# Patient Record
Sex: Female | Born: 1935 | Race: White | Hispanic: No | Marital: Married | Smoking: Never smoker
Health system: Southern US, Community
[De-identification: ages and names within clinical notes are randomized; demographics above are authoritative.]

## PROBLEM LIST (undated history)

## (undated) DIAGNOSIS — J302 Other seasonal allergic rhinitis: Secondary | ICD-10-CM

## (undated) DIAGNOSIS — M2540 Effusion, unspecified joint: Secondary | ICD-10-CM

## (undated) DIAGNOSIS — I10 Essential (primary) hypertension: Secondary | ICD-10-CM

## (undated) DIAGNOSIS — H9190 Unspecified hearing loss, unspecified ear: Secondary | ICD-10-CM

## (undated) DIAGNOSIS — H269 Unspecified cataract: Secondary | ICD-10-CM

## (undated) DIAGNOSIS — Z8711 Personal history of peptic ulcer disease: Secondary | ICD-10-CM

## (undated) DIAGNOSIS — Z8719 Personal history of other diseases of the digestive system: Secondary | ICD-10-CM

## (undated) DIAGNOSIS — Z87442 Personal history of urinary calculi: Secondary | ICD-10-CM

## (undated) DIAGNOSIS — M359 Systemic involvement of connective tissue, unspecified: Secondary | ICD-10-CM

## (undated) DIAGNOSIS — K219 Gastro-esophageal reflux disease without esophagitis: Secondary | ICD-10-CM

## (undated) DIAGNOSIS — K5790 Diverticulosis of intestine, part unspecified, without perforation or abscess without bleeding: Secondary | ICD-10-CM

## (undated) DIAGNOSIS — Z9981 Dependence on supplemental oxygen: Secondary | ICD-10-CM

## (undated) DIAGNOSIS — M069 Rheumatoid arthritis, unspecified: Secondary | ICD-10-CM

## (undated) DIAGNOSIS — M2550 Pain in unspecified joint: Secondary | ICD-10-CM

## (undated) DIAGNOSIS — M1990 Unspecified osteoarthritis, unspecified site: Secondary | ICD-10-CM

## (undated) DIAGNOSIS — J45909 Unspecified asthma, uncomplicated: Secondary | ICD-10-CM

## (undated) DIAGNOSIS — E785 Hyperlipidemia, unspecified: Secondary | ICD-10-CM

## (undated) HISTORY — PX: TONSILLECTOMY: SUR1361

## (undated) HISTORY — PX: COLONOSCOPY: SHX174

## (undated) HISTORY — PX: LITHOTRIPSY: SUR834

## (undated) HISTORY — PX: BIOPSY THYROID: PRO38

## (undated) HISTORY — PX: ABDOMINAL HYSTERECTOMY: SHX81

## (undated) HISTORY — PX: APPENDECTOMY: SHX54

## (undated) HISTORY — PX: CHOLECYSTECTOMY: SHX55

## (undated) HISTORY — PX: COLONOSCOPY WITH ESOPHAGOGASTRODUODENOSCOPY (EGD) AND ESOPHAGEAL DILATION (ED): SHX6495

## (undated) HISTORY — PX: OTHER SURGICAL HISTORY: SHX169

---

## 2014-12-30 ENCOUNTER — Emergency Department (HOSPITAL_COMMUNITY)
Admission: EM | Admit: 2014-12-30 | Discharge: 2014-12-30 | Disposition: A | Discharge status: Home / Self Care | Payer: Medicare Other | Attending: Emergency Medicine | Admitting: Emergency Medicine

## 2014-12-30 ENCOUNTER — Emergency Department (HOSPITAL_COMMUNITY): Payer: Medicare Other

## 2014-12-30 ENCOUNTER — Encounter (HOSPITAL_COMMUNITY): Payer: Self-pay | Admitting: Emergency Medicine

## 2014-12-30 DIAGNOSIS — Z9981 Dependence on supplemental oxygen: Secondary | ICD-10-CM | POA: Diagnosis not present

## 2014-12-30 DIAGNOSIS — R109 Unspecified abdominal pain: Secondary | ICD-10-CM

## 2014-12-30 DIAGNOSIS — I10 Essential (primary) hypertension: Secondary | ICD-10-CM | POA: Insufficient documentation

## 2014-12-30 DIAGNOSIS — R1013 Epigastric pain: Secondary | ICD-10-CM | POA: Insufficient documentation

## 2014-12-30 DIAGNOSIS — J45909 Unspecified asthma, uncomplicated: Secondary | ICD-10-CM | POA: Diagnosis not present

## 2014-12-30 DIAGNOSIS — Z7951 Long term (current) use of inhaled steroids: Secondary | ICD-10-CM | POA: Insufficient documentation

## 2014-12-30 DIAGNOSIS — Z9049 Acquired absence of other specified parts of digestive tract: Secondary | ICD-10-CM | POA: Insufficient documentation

## 2014-12-30 DIAGNOSIS — Z7982 Long term (current) use of aspirin: Secondary | ICD-10-CM | POA: Insufficient documentation

## 2014-12-30 DIAGNOSIS — Z9889 Other specified postprocedural states: Secondary | ICD-10-CM | POA: Diagnosis not present

## 2014-12-30 DIAGNOSIS — Z8719 Personal history of other diseases of the digestive system: Secondary | ICD-10-CM | POA: Insufficient documentation

## 2014-12-30 DIAGNOSIS — Z79899 Other long term (current) drug therapy: Secondary | ICD-10-CM | POA: Insufficient documentation

## 2014-12-30 DIAGNOSIS — Z90710 Acquired absence of both cervix and uterus: Secondary | ICD-10-CM | POA: Insufficient documentation

## 2014-12-30 HISTORY — DX: Unspecified asthma, uncomplicated: J45.909

## 2014-12-30 HISTORY — DX: Essential (primary) hypertension: I10

## 2014-12-30 HISTORY — DX: Dependence on supplemental oxygen: Z99.81

## 2014-12-30 HISTORY — DX: Other seasonal allergic rhinitis: J30.2

## 2014-12-30 LAB — COMPREHENSIVE METABOLIC PANEL
ALK PHOS: 43 U/L (ref 38–126)
ALT: 31 U/L (ref 14–54)
AST: 37 U/L (ref 15–41)
Albumin: 4.2 g/dL (ref 3.5–5.0)
Anion gap: 11 (ref 5–15)
BUN: 11 mg/dL (ref 6–20)
CALCIUM: 9.7 mg/dL (ref 8.9–10.3)
CO2: 27 mmol/L (ref 22–32)
Chloride: 104 mmol/L (ref 101–111)
Creatinine, Ser: 0.8 mg/dL (ref 0.44–1.00)
GLUCOSE: 98 mg/dL (ref 65–99)
POTASSIUM: 3.8 mmol/L (ref 3.5–5.1)
SODIUM: 142 mmol/L (ref 135–145)
Total Bilirubin: 0.7 mg/dL (ref 0.3–1.2)
Total Protein: 7.1 g/dL (ref 6.5–8.1)

## 2014-12-30 LAB — URINE MICROSCOPIC-ADD ON

## 2014-12-30 LAB — URINALYSIS, ROUTINE W REFLEX MICROSCOPIC
Bilirubin Urine: NEGATIVE
Glucose, UA: NEGATIVE mg/dL
HGB URINE DIPSTICK: NEGATIVE
Ketones, ur: NEGATIVE mg/dL
Nitrite: NEGATIVE
Protein, ur: NEGATIVE mg/dL
UROBILINOGEN UA: 0.2 mg/dL (ref 0.0–1.0)
pH: 6 (ref 5.0–8.0)

## 2014-12-30 LAB — CBC WITH DIFFERENTIAL/PLATELET
Basophils Absolute: 0 10*3/uL (ref 0.0–0.1)
Basophils Relative: 0 % (ref 0–1)
EOS ABS: 0.3 10*3/uL (ref 0.0–0.7)
EOS PCT: 5 % (ref 0–5)
HEMATOCRIT: 33.8 % — AB (ref 36.0–46.0)
Hemoglobin: 10.9 g/dL — ABNORMAL LOW (ref 12.0–15.0)
Lymphocytes Relative: 34 % (ref 12–46)
Lymphs Abs: 1.6 10*3/uL (ref 0.7–4.0)
MCH: 31.6 pg (ref 26.0–34.0)
MCHC: 32.2 g/dL (ref 30.0–36.0)
MCV: 98 fL (ref 78.0–100.0)
MONO ABS: 0.6 10*3/uL (ref 0.1–1.0)
Monocytes Relative: 12 % (ref 3–12)
Neutro Abs: 2.3 10*3/uL (ref 1.7–7.7)
Neutrophils Relative %: 49 % (ref 43–77)
Platelets: 185 10*3/uL (ref 150–400)
RBC: 3.45 MIL/uL — ABNORMAL LOW (ref 3.87–5.11)
RDW: 15.2 % (ref 11.5–15.5)
WBC: 4.8 10*3/uL (ref 4.0–10.5)

## 2014-12-30 LAB — LIPASE, BLOOD: LIPASE: 35 U/L (ref 22–51)

## 2014-12-30 MED ORDER — PANTOPRAZOLE SODIUM 20 MG PO TBEC: 20.0000 mg | DELAYED_RELEASE_TABLET | Freq: Every day | ORAL | Status: DC

## 2014-12-30 MED ORDER — TRAMADOL HCL 50 MG PO TABS: 50.0000 mg | ORAL_TABLET | Freq: Four times a day (QID) | ORAL | Status: DC | PRN

## 2014-12-30 MED ORDER — PANTOPRAZOLE SODIUM 40 MG IV SOLR
40.0000 mg | Freq: Once | INTRAVENOUS | Status: AC
Start: 2014-12-30 — End: 2014-12-30
  Administered 2014-12-30: 40 mg via INTRAVENOUS
  Filled 2014-12-30: qty 40

## 2014-12-30 MED ORDER — ONDANSETRON HCL 4 MG/2ML IJ SOLN
4.0000 mg | Freq: Once | INTRAMUSCULAR | Status: AC
  Administered 2014-12-30: 4 mg via INTRAVENOUS
  Filled 2014-12-30: qty 2

## 2014-12-30 MED ORDER — IOHEXOL 300 MG/ML  SOLN
25.0000 mL | Freq: Once | INTRAMUSCULAR | Status: AC | PRN
Start: 2014-12-30 — End: 2014-12-30
  Administered 2014-12-30: 25 mL via ORAL

## 2014-12-30 MED ORDER — IOHEXOL 300 MG/ML  SOLN
100.0000 mL | Freq: Once | INTRAMUSCULAR | Status: AC | PRN
  Administered 2014-12-30: 100 mL via INTRAVENOUS

## 2014-12-30 NOTE — ED Provider Notes (Signed)
CSN: [id number]     Arrival date & time 12/30/14  1517 History   First MD Initiated Contact with Patient 12/30/14 1527     Chief Complaint  Patient presents with  . Abdominal Pain     (Consider location/radiation/quality/duration/timing/severity/associated sxs/prior Treatment) Patient is a 80 y.o. female presenting with abdominal pain. The history is provided by the patient (the pt complains of upper abd pain).  Abdominal Pain Pain location:  Epigastric Pain quality: aching   Pain radiates to:  Does not radiate Pain severity:  Mild Onset quality:  Gradual Timing:  Intermittent Progression:  Waxing and waning Chronicity:  New Associated symptoms: no chest pain, no cough, no diarrhea, no fatigue and no hematuria     Past Medical History  Diagnosis Date  . Hypertension   . Seasonal allergies   . On home oxygen therapy   . Hyperlipemia   . Asthma   . Hiatal hernia    Past Surgical History  Procedure Laterality Date  . Cardiac catherization    . Abdominal hysterectomy    . Appendectomy    . Cholecystectomy    . Tonsillectomy    . Skin cancer removal     Family History  Problem Relation Age of Onset  . Cancer Mother   . Asthma Mother    History  Substance Use Topics  . Smoking status: Never Smoker   . Smokeless tobacco: Not on file  . Alcohol Use: No   OB History    Gravida Para Term Preterm AB TAB SAB Ectopic Multiple Living            1     Review of Systems  Constitutional: Negative for appetite change and fatigue.  HENT: Negative for congestion, ear discharge and sinus pressure.   Eyes: Negative for discharge.  Respiratory: Negative for cough.   Cardiovascular: Negative for chest pain.  Gastrointestinal: Positive for abdominal pain. Negative for diarrhea.  Genitourinary: Negative for frequency and hematuria.  Musculoskeletal: Negative for back pain.  Skin: Negative for rash.  Neurological: Negative for seizures and headaches.  Psychiatric/Behavioral:  Negative for hallucinations.      Allergies  Review of patient's allergies indicates no known allergies.  Home Medications   Prior to Admission medications   Medication Sig Start Date End Date Taking? Authorizing Provider  aspirin 81 MG chewable tablet Chew 81 mg by mouth daily.   Yes Historical Provider, MD  Calcium Carbonate-Vitamin D (CALTRATE 600+D PO) Take 1 tablet by mouth 2 (two) times daily.   Yes Historical Provider, MD  cholecalciferol (VITAMIN D) 1000 UNITS tablet Take 1,000 Units by mouth daily.   Yes Historical Provider, MD  CRESTOR 20 MG tablet Take 20 mg by mouth at bedtime. 12/29/14  Yes Historical Provider, MD  folic acid (FOLVITE) 1 MG tablet Take 1 mg by mouth daily. 12/18/14  Yes Historical Provider, MD  loratadine (CLARITIN) 10 MG tablet Take 10 mg by mouth daily. 11/10/14  Yes Historical Provider, MD  losartan-hydrochlorothiazide (HYZAAR) 100-12.5 MG per tablet Take 1 tablet by mouth daily.   Yes Historical Provider, MD  methotrexate (RHEUMATREX) 2.5 MG tablet Take 15 mg by mouth once a week. Monday 12/01/14  Yes Historical Provider, MD  mometasone (NASONEX) 50 MCG/ACT nasal spray Place 2 sprays into the nose daily.   Yes Historical Provider, MD  montelukast (SINGULAIR) 10 MG tablet Take 10 mg by mouth every evening. 11/10/14  Yes Historical Provider, MD  Multiple Vitamin (MULTIVITAMIN) tablet Take 1 tablet by mouth  daily.   Yes Historical Provider, MD  Omega 3 1000 MG CAPS Take 1,000 mg by mouth daily.   Yes Historical Provider, MD  pantoprazole (PROTONIX) 20 MG tablet Take 1 tablet (20 mg total) by mouth daily. 12/30/14   Milton Ferguson, MD  traMADol (ULTRAM) 50 MG tablet Take 1 tablet (50 mg total) by mouth every 6 (six) hours as needed. 12/30/14   Milton Ferguson, MD   BP 109/63 mmHg  Pulse 85  Temp(Src) 97.9 F (36.6 C) (Oral)  Resp 18  SpO2 95% Physical Exam  Constitutional: She is oriented to person, place, and time. She appears well-developed.  HENT:  Head:  Normocephalic.  Eyes: Conjunctivae and EOM are normal. No scleral icterus.  Neck: Neck supple. No thyromegaly present.  Cardiovascular: Normal rate and regular rhythm.  Exam reveals no gallop and no friction rub.   No murmur heard. Pulmonary/Chest: No stridor. She has no wheezes. She has no rales. She exhibits no tenderness.  Abdominal: She exhibits no distension. There is tenderness. There is no rebound.  Mild tender epigastric  Musculoskeletal: Normal range of motion. She exhibits no edema.  Lymphadenopathy:    She has no cervical adenopathy.  Neurological: She is oriented to person, place, and time. She exhibits normal muscle tone. Coordination normal.  Skin: No rash noted. No erythema.  Psychiatric: She has a normal mood and affect. Her behavior is normal.    ED Course  Procedures (including critical care time) Labs Review Labs Reviewed  CBC WITH DIFFERENTIAL/PLATELET - Abnormal; Notable for the following:    RBC 3.45 (*)    Hemoglobin 10.9 (*)    HCT 33.8 (*)    All other components within normal limits  URINALYSIS, ROUTINE W REFLEX MICROSCOPIC - Abnormal; Notable for the following:    Specific Gravity, Urine <1.005 (*)    Leukocytes, UA SMALL (*)    All other components within normal limits  URINE MICROSCOPIC-ADD ON - Abnormal; Notable for the following:    Squamous Epithelial / LPF FEW (*)    All other components within normal limits  COMPREHENSIVE METABOLIC PANEL  LIPASE, BLOOD    Imaging Review Ct Abdomen Pelvis W Contrast  12/30/2014   CLINICAL DATA:  Acute onset epigastric  EXAM: CT ABDOMEN AND PELVIS WITH CONTRAST  TECHNIQUE: Multidetector CT imaging of the abdomen and pelvis was performed using the standard protocol following bolus administration of intravenous contrast.  CONTRAST:  162mL OMNIPAQUE IOHEXOL 300 MG/ML  SOLN  COMPARISON:  None.  FINDINGS: Mild left basilar atelectasis is noted. A large contrast filled hiatal hernia is noted. Mild calcification is noted  at the mitral and aortic valves.  The liver and spleen are unremarkable in appearance. The gallbladder is within normal limits. The pancreas and adrenal glands are unremarkable.  Small bilateral renal cysts are seen, measuring up to 1.6 cm in size. There is mild right renal scarring. Bilateral extrarenal pelves remain within normal limits. A 4 mm nonobstructing stone is noted at the interpole region of the right kidney. No perinephric stranding is appreciated. No obstructing ureteral stones are seen. There is no evidence of hydronephrosis.  No free fluid is identified. The small bowel is unremarkable in appearance. The stomach is within normal limits. No acute vascular abnormalities are seen. Mild scattered calcification is seen along the abdominal aorta and its branches.  The patient is status post appendectomy. Scattered diverticulosis is noted along the descending and proximal sigmoid colon, without evidence of diverticulitis.  The bladder is mildly  distended and grossly unremarkable. The patient is status post hysterectomy. The ovaries are relatively symmetric. No suspicious adnexal masses are seen. No inguinal lymphadenopathy is seen.  No acute osseous abnormalities are identified. Vacuum phenomenon is noted at multiple levels along the lumbar spine.  IMPRESSION: 1. No acute abnormality seen to explain the patient's symptoms. 2. Large contrast hiatal hernia seen. Associated mild left basilar atelectasis noted. 3. Small bilateral renal cysts noted. Mild right renal scarring. 4 mm nonobstructing stone at the interpole region of the right kidney. 4. Mild scattered calcification along the abdominal aorta and its branches. 5. Scattered diverticulosis along the descending and proximal sigmoid colon, without evidence of diverticulitis.   Electronically Signed   By: Garald Balding M.D.   On: 12/30/2014 17:23     EKG Interpretation None      MDM   Final diagnoses:  Abdominal pain    abd pain,  Studies neg,   rx with protonix and ultram with follow up with pcp    Milton Ferguson, MD 12/30/14 1905

## 2014-12-30 NOTE — ED Notes (Signed)
PT reports epigastric abdominal pain with tenderness on palpation x1 week and has noticed black stools. PT states she went to [facility] ED on 12/17/14 and ended up having a cardiac craterization but no stent was placed. PT also reports generalized weakness x2 weeks.

## 2014-12-30 NOTE — Discharge Instructions (Signed)
Follow up with your md in a week for recheck

## 2014-12-30 NOTE — ED Notes (Signed)
MD at bedside. 

## 2014-12-30 NOTE — ED Notes (Signed)
Pt alert & oriented x4, stable gait. Patient given discharge instructions, paperwork & prescription(s). Patient  instructed to stop at the registration desk to finish any additional paperwork. Patient verbalized understanding. Pt left department w/ no further questions. 

## 2015-01-14 HISTORY — PX: ESOPHAGOGASTRODUODENOSCOPY: SHX1529

## 2015-05-16 HISTORY — PX: COLONOSCOPY: SHX174

## 2015-06-30 ENCOUNTER — Other Ambulatory Visit: Payer: Self-pay | Admitting: Orthopedic Surgery

## 2015-07-08 ENCOUNTER — Other Ambulatory Visit (HOSPITAL_COMMUNITY): Payer: Medicare Other

## 2015-07-10 ENCOUNTER — Encounter (HOSPITAL_COMMUNITY): Payer: Self-pay

## 2015-07-10 ENCOUNTER — Encounter (HOSPITAL_COMMUNITY)
Admission: RE | Admit: 2015-07-10 | Discharge: 2015-07-10 | Disposition: A | Discharge status: Home / Self Care | Payer: Medicare Other | Source: Ambulatory Visit | Attending: Orthopedic Surgery | Admitting: Orthopedic Surgery

## 2015-07-10 DIAGNOSIS — M069 Rheumatoid arthritis, unspecified: Secondary | ICD-10-CM | POA: Insufficient documentation

## 2015-07-10 DIAGNOSIS — Z01818 Encounter for other preprocedural examination: Secondary | ICD-10-CM | POA: Insufficient documentation

## 2015-07-10 DIAGNOSIS — I10 Essential (primary) hypertension: Secondary | ICD-10-CM | POA: Diagnosis not present

## 2015-07-10 DIAGNOSIS — J45909 Unspecified asthma, uncomplicated: Secondary | ICD-10-CM | POA: Insufficient documentation

## 2015-07-10 DIAGNOSIS — M1711 Unilateral primary osteoarthritis, right knee: Secondary | ICD-10-CM | POA: Insufficient documentation

## 2015-07-10 DIAGNOSIS — K219 Gastro-esophageal reflux disease without esophagitis: Secondary | ICD-10-CM | POA: Diagnosis not present

## 2015-07-10 DIAGNOSIS — Z01812 Encounter for preprocedural laboratory examination: Secondary | ICD-10-CM | POA: Insufficient documentation

## 2015-07-10 DIAGNOSIS — E785 Hyperlipidemia, unspecified: Secondary | ICD-10-CM | POA: Diagnosis not present

## 2015-07-10 DIAGNOSIS — Z79899 Other long term (current) drug therapy: Secondary | ICD-10-CM | POA: Diagnosis not present

## 2015-07-10 HISTORY — DX: Personal history of other diseases of the digestive system: Z87.19

## 2015-07-10 HISTORY — DX: Diverticulosis of intestine, part unspecified, without perforation or abscess without bleeding: K57.90

## 2015-07-10 HISTORY — DX: Unspecified osteoarthritis, unspecified site: M19.90

## 2015-07-10 HISTORY — DX: Rheumatoid arthritis, unspecified: M06.9

## 2015-07-10 HISTORY — DX: Unspecified hearing loss, unspecified ear: H91.90

## 2015-07-10 HISTORY — DX: Pain in unspecified joint: M25.50

## 2015-07-10 HISTORY — DX: Unspecified cataract: H26.9

## 2015-07-10 HISTORY — DX: Personal history of urinary calculi: Z87.442

## 2015-07-10 HISTORY — DX: Gastro-esophageal reflux disease without esophagitis: K21.9

## 2015-07-10 HISTORY — DX: Effusion, unspecified joint: M25.40

## 2015-07-10 HISTORY — DX: Hyperlipidemia, unspecified: E78.5

## 2015-07-10 HISTORY — DX: Personal history of peptic ulcer disease: Z87.11

## 2015-07-10 LAB — URINALYSIS, ROUTINE W REFLEX MICROSCOPIC
Bilirubin Urine: NEGATIVE
GLUCOSE, UA: NEGATIVE mg/dL
Hgb urine dipstick: NEGATIVE
Ketones, ur: NEGATIVE mg/dL
LEUKOCYTES UA: NEGATIVE
NITRITE: NEGATIVE
PH: 7 (ref 5.0–8.0)
PROTEIN: NEGATIVE mg/dL
Specific Gravity, Urine: 1.009 (ref 1.005–1.030)

## 2015-07-10 LAB — CBC WITH DIFFERENTIAL/PLATELET
Basophils Absolute: 0 10*3/uL (ref 0.0–0.1)
Basophils Relative: 0 %
Eosinophils Absolute: 0.2 10*3/uL (ref 0.0–0.7)
Eosinophils Relative: 3 %
HCT: 40.1 % (ref 36.0–46.0)
Hemoglobin: 12.8 g/dL (ref 12.0–15.0)
Lymphocytes Relative: 40 %
Lymphs Abs: 2.4 10*3/uL (ref 0.7–4.0)
MCH: 29.1 pg (ref 26.0–34.0)
MCHC: 31.9 g/dL (ref 30.0–36.0)
MCV: 91.1 fL (ref 78.0–100.0)
Monocytes Absolute: 0.4 10*3/uL (ref 0.1–1.0)
Monocytes Relative: 7 %
Neutro Abs: 3 10*3/uL (ref 1.7–7.7)
Neutrophils Relative %: 50 %
Platelets: 177 10*3/uL (ref 150–400)
RBC: 4.4 MIL/uL (ref 3.87–5.11)
RDW: 14.6 % (ref 11.5–15.5)
WBC: 6 10*3/uL (ref 4.0–10.5)

## 2015-07-10 LAB — COMPREHENSIVE METABOLIC PANEL
ALT: 26 U/L (ref 14–54)
AST: 23 U/L (ref 15–41)
Albumin: 4.3 g/dL (ref 3.5–5.0)
Alkaline Phosphatase: 65 U/L (ref 38–126)
Anion gap: 6 (ref 5–15)
BUN: 12 mg/dL (ref 6–20)
CHLORIDE: 102 mmol/L (ref 101–111)
CO2: 31 mmol/L (ref 22–32)
CREATININE: 0.7 mg/dL (ref 0.44–1.00)
Calcium: 10.7 mg/dL — ABNORMAL HIGH (ref 8.9–10.3)
GFR calc Af Amer: >60 mL/min (ref >60)
GFR calc non Af Amer: >60 mL/min (ref >60)
Glucose, Bld: 93 mg/dL (ref 65–99)
Potassium: 4.2 mmol/L (ref 3.5–5.1)
SODIUM: 139 mmol/L (ref 135–145)
Total Bilirubin: 0.6 mg/dL (ref 0.3–1.2)
Total Protein: 7.7 g/dL (ref 6.5–8.1)

## 2015-07-10 LAB — PROTIME-INR
INR: 0.97 (ref 0.00–1.49)
Prothrombin Time: 13.1 seconds (ref 11.6–15.2)

## 2015-07-10 LAB — APTT: APTT: 26 s (ref 24–37)

## 2015-07-10 LAB — SURGICAL PCR SCREEN
MRSA, PCR: NEGATIVE
Staphylococcus aureus: NEGATIVE

## 2015-07-10 MED ORDER — CHLORHEXIDINE GLUCONATE 4 % EX LIQD: 60.0000 mL | Freq: Once | CUTANEOUS | Status: DC

## 2015-07-10 NOTE — Pre-Procedure Instructions (Signed)
Kelany Hanlan  07/10/2015      [facility] [address], [place] AT [address] [place] [address] Phone: [phone] Fax: [phone]    Your procedure is scheduled on Mon, Dec 5 @ 8:45 AM  Report to [place] at 6:45 AM  Call this number if you have problems the morning of surgery:  [phone]   Remember:  Do not eat food or drink liquids after midnight.  Take these medicines the morning of surgery with A SIP OF WATER Diltiazem(Cardizem),Claritin(Loratadine),Nasonex(Mometasone),and Omeprazole(Prilosec)               Stop taking your Omega 3. No Goody's,BC's,Aleve,Aspirin,Ibuprofen,Advil,Motrin,or any Herbal Medications.    Do not wear jewelry, make-up or nail polish.  Do not wear lotions, powders, or perfumes.  You may wear deodorant.  Do not shave 48 hours prior to surgery.    Do not bring valuables to the hospital.  [facility] is not responsible for any belongings or valuables.  Contacts, dentures or bridgework may not be worn into surgery.  Leave your suitcase in the car.  After surgery it may be brought to your room.  For patients admitted to the hospital, discharge time will be determined by your treatment team.  Patients discharged the day of surgery will not be allowed to drive home.    Special instructions:  [facility] - Preparing for Surgery  Before surgery, you can play an important role.  Because skin is not sterile, your skin needs to be as free of germs as possible.  You can reduce the number of germs on you skin by washing with CHG (chlorahexidine gluconate) soap before surgery.  CHG is an antiseptic cleaner which kills germs and bonds with the skin to continue killing germs even after washing.  Please DO NOT use if you have an allergy to CHG or antibacterial soaps.  If your skin becomes reddened/irritated stop using the CHG and inform your nurse when you arrive at Short  Stay.  Do not shave (including legs and underarms) for at least 48 hours prior to the first CHG shower.  You may shave your face.  Please follow these instructions carefully:   1.  Shower with CHG Soap the night before surgery and the                                morning of Surgery.  2.  If you choose to wash your hair, wash your hair first as usual with your       normal shampoo.  3.  After you shampoo, rinse your hair and body thoroughly to remove the                      Shampoo.  4.  Use CHG as you would any other liquid soap.  You can apply chg directly       to the skin and wash gently with scrungie or a clean washcloth.  5.  Apply the CHG Soap to your body ONLY FROM THE NECK DOWN.        Do not use on open wounds or open sores.  Avoid contact with your eyes,       ears, mouth and genitals (private parts).  Wash genitals (private parts)       with your normal soap.  6.  Wash thoroughly, paying special attention to the area where your surgery        will be performed.  7.  Thoroughly rinse your body with warm water from the neck down.  8.  DO NOT shower/wash with your normal soap after using and rinsing off       the CHG Soap.  9.  Pat yourself dry with a clean towel.            10.  Wear clean pajamas.            11.  Place clean sheets on your bed the night of your first shower and do not        sleep with pets.  Day of Surgery  Do not apply any lotions/deoderants the morning of surgery.  Please wear clean clothes to the hospital/surgery center.    Please read over the following fact sheets that you were given. Pain Booklet, Coughing and Deep Breathing, Surgical Site Infection Prevention and Anesthesia Post-op Instructions

## 2015-07-10 NOTE — Progress Notes (Signed)
Cardiologist is Janith Lima in [place] request visit  Medical Md is Dr.James [place] Deck [phone]  Stress test done last wk-to request report  Heart cath done in May 2016-to request report from [facility]  Echo done about 3 months ago-to request from [facility]  EKG to be requested from [place]  CXR to be requested from Dr.O'neill-done 3 wks ago

## 2015-07-11 LAB — URINE CULTURE

## 2015-07-13 NOTE — Progress Notes (Addendum)
Anesthesia Chart Review:  Pt is 80 year old female scheduled for R total knee arthroplasty on 07/20/2015 with Dr. Ronnie Derby.   Cardiologist is Dr. Mickey Farber. [facility] in [place], [place].   PMH includes:  HTN, hyperlipidemia, asthma, RA, GERD. Uses 2L oxygen at night. Hard of hearing. Never smoker. BMI 32.   Medications include: diltiazem, losartan-hctz, prilosec.   Preoperative labs reviewed.    Chest x-ray report 07/15/15 reviewed. No acute cardiopulmonary disease.   EKG 05/21/15: sinus tachycardia (103 bpm). RBBB.   Nuclear stress test 06/25/15:  1. Normal stress test with no evidence of ischemia at work load achieved.  2. EF 55%  Cardiac cath 12/17/14 [facility]):  1. PVR is 2.91 which is increased, but the PA mean is 20. PA and PVR are on the higher side.  2. Nonobstructive CAD with mild diffuse distal tapering of LAD, RCA with 25% in the distal portion.   Echo 12/10/14:  1. LV shows normal dimension. No WMA. EF 55-60%. LVH. Grade I diastolic dysfunction.  2. Trace MR. MV thickened 3. Aortic sclerosis with calcification without decreased ACS. Mild AR.  4. LA enlargement  If no changes, I anticipate pt can proceed with surgery as scheduled.   Willeen Cass, FNP-BC [facility] Short Stay Surgical Center/Anesthesiology Phone: [phone] 07/16/2015 11:54 AM

## 2015-07-19 MED ORDER — BUPIVACAINE LIPOSOME 1.3 % IJ SUSP
20.0000 mL | INTRAMUSCULAR | Status: DC
  Filled 2015-07-19 (×2): qty 20

## 2015-07-19 MED ORDER — TRANEXAMIC ACID 1000 MG/10ML IV SOLN
1000.0000 mg | INTRAVENOUS | Status: AC
  Administered 2015-07-20: 1000 mg via INTRAVENOUS
  Filled 2015-07-19: qty 10

## 2015-07-19 MED ORDER — CEFAZOLIN SODIUM-DEXTROSE 2-3 GM-% IV SOLR
2.0000 g | INTRAVENOUS | Status: AC
  Administered 2015-07-20: 2 g via INTRAVENOUS
  Filled 2015-07-19: qty 50

## 2015-07-20 ENCOUNTER — Encounter (HOSPITAL_COMMUNITY)
Admission: RE | Disposition: A | Discharge status: Home Health | Payer: Self-pay | Source: Ambulatory Visit | Attending: Orthopedic Surgery

## 2015-07-20 ENCOUNTER — Encounter (HOSPITAL_COMMUNITY): Payer: Self-pay | Admitting: Anesthesiology

## 2015-07-20 ENCOUNTER — Inpatient Hospital Stay (HOSPITAL_COMMUNITY): Payer: Medicare Other | Admitting: Anesthesiology

## 2015-07-20 ENCOUNTER — Inpatient Hospital Stay (HOSPITAL_COMMUNITY): Payer: Medicare Other | Admitting: Vascular Surgery

## 2015-07-20 ENCOUNTER — Inpatient Hospital Stay (HOSPITAL_COMMUNITY)
Admission: RE | Admit: 2015-07-20 | Discharge: 2015-07-21 | DRG: 470 | Disposition: A | Discharge status: Home Health | Payer: Medicare Other | Source: Ambulatory Visit | Attending: Orthopedic Surgery | Admitting: Orthopedic Surgery

## 2015-07-20 DIAGNOSIS — M1711 Unilateral primary osteoarthritis, right knee: Secondary | ICD-10-CM | POA: Diagnosis present

## 2015-07-20 DIAGNOSIS — E785 Hyperlipidemia, unspecified: Secondary | ICD-10-CM | POA: Diagnosis present

## 2015-07-20 DIAGNOSIS — K219 Gastro-esophageal reflux disease without esophagitis: Secondary | ICD-10-CM | POA: Diagnosis present

## 2015-07-20 DIAGNOSIS — J45909 Unspecified asthma, uncomplicated: Secondary | ICD-10-CM | POA: Diagnosis present

## 2015-07-20 DIAGNOSIS — Z8711 Personal history of peptic ulcer disease: Secondary | ICD-10-CM | POA: Diagnosis not present

## 2015-07-20 DIAGNOSIS — M25561 Pain in right knee: Secondary | ICD-10-CM | POA: Diagnosis present

## 2015-07-20 DIAGNOSIS — Z825 Family history of asthma and other chronic lower respiratory diseases: Secondary | ICD-10-CM | POA: Diagnosis not present

## 2015-07-20 DIAGNOSIS — I2510 Atherosclerotic heart disease of native coronary artery without angina pectoris: Secondary | ICD-10-CM | POA: Diagnosis present

## 2015-07-20 DIAGNOSIS — H9190 Unspecified hearing loss, unspecified ear: Secondary | ICD-10-CM | POA: Diagnosis present

## 2015-07-20 DIAGNOSIS — I10 Essential (primary) hypertension: Secondary | ICD-10-CM | POA: Diagnosis present

## 2015-07-20 DIAGNOSIS — D62 Acute posthemorrhagic anemia: Secondary | ICD-10-CM | POA: Diagnosis not present

## 2015-07-20 DIAGNOSIS — M069 Rheumatoid arthritis, unspecified: Secondary | ICD-10-CM | POA: Diagnosis present

## 2015-07-20 DIAGNOSIS — Z96659 Presence of unspecified artificial knee joint: Secondary | ICD-10-CM

## 2015-07-20 HISTORY — PX: TOTAL KNEE ARTHROPLASTY: SHX125

## 2015-07-20 LAB — CBC
HEMATOCRIT: 36.5 % (ref 36.0–46.0)
Hemoglobin: 11.4 g/dL — ABNORMAL LOW (ref 12.0–15.0)
MCH: 28.9 pg (ref 26.0–34.0)
MCHC: 31.2 g/dL (ref 30.0–36.0)
MCV: 92.6 fL (ref 78.0–100.0)
Platelets: 176 10*3/uL (ref 150–400)
RBC: 3.94 MIL/uL (ref 3.87–5.11)
RDW: 14.4 % (ref 11.5–15.5)
WBC: 7.4 10*3/uL (ref 4.0–10.5)

## 2015-07-20 LAB — CREATININE, SERUM
Creatinine, Ser: 0.76 mg/dL (ref 0.44–1.00)
GFR calc non Af Amer: >60 mL/min (ref >60)

## 2015-07-20 SURGERY — ARTHROPLASTY, KNEE, TOTAL
Anesthesia: Spinal | Laterality: Right

## 2015-07-20 MED ORDER — DILTIAZEM HCL 30 MG PO TABS
30.0000 mg | ORAL_TABLET | Freq: Two times a day (BID) | ORAL | Status: DC
  Administered 2015-07-20 – 2015-07-21 (×2): 30 mg via ORAL
  Filled 2015-07-20 (×4): qty 1

## 2015-07-20 MED ORDER — PHENYLEPHRINE 40 MCG/ML (10ML) SYRINGE FOR IV PUSH (FOR BLOOD PRESSURE SUPPORT)
PREFILLED_SYRINGE | INTRAVENOUS | Status: AC
  Filled 2015-07-20: qty 10

## 2015-07-20 MED ORDER — PROPOFOL 10 MG/ML IV BOLUS
INTRAVENOUS | Status: AC
  Filled 2015-07-20: qty 20

## 2015-07-20 MED ORDER — PHENYLEPHRINE HCL 10 MG/ML IJ SOLN
INTRAMUSCULAR | Status: DC | PRN
  Administered 2015-07-20: 80 ug via INTRAVENOUS
  Administered 2015-07-20 (×2): 40 ug via INTRAVENOUS

## 2015-07-20 MED ORDER — SODIUM CHLORIDE 0.9 % IJ SOLN
INTRAMUSCULAR | Status: AC
  Filled 2015-07-20: qty 10

## 2015-07-20 MED ORDER — ALUM & MAG HYDROXIDE-SIMETH 200-200-20 MG/5ML PO SUSP: 30.0000 mL | ORAL | Status: DC | PRN

## 2015-07-20 MED ORDER — LIDOCAINE HCL (CARDIAC) 20 MG/ML IV SOLN
INTRAVENOUS | Status: AC
  Filled 2015-07-20: qty 5

## 2015-07-20 MED ORDER — ONDANSETRON HCL 4 MG PO TABS: 4.0000 mg | ORAL_TABLET | Freq: Four times a day (QID) | ORAL | Status: DC | PRN

## 2015-07-20 MED ORDER — SENNOSIDES-DOCUSATE SODIUM 8.6-50 MG PO TABS: 1.0000 | ORAL_TABLET | Freq: Every evening | ORAL | Status: DC | PRN

## 2015-07-20 MED ORDER — HYDROCHLOROTHIAZIDE 12.5 MG PO CAPS
12.5000 mg | ORAL_CAPSULE | Freq: Every day | ORAL | Status: DC
  Administered 2015-07-21: 12.5 mg via ORAL
  Filled 2015-07-20: qty 1

## 2015-07-20 MED ORDER — BISACODYL 5 MG PO TBEC: 5.0000 mg | DELAYED_RELEASE_TABLET | Freq: Every day | ORAL | Status: DC | PRN

## 2015-07-20 MED ORDER — ONDANSETRON HCL 4 MG/2ML IJ SOLN: 4.0000 mg | Freq: Four times a day (QID) | INTRAMUSCULAR | Status: DC | PRN

## 2015-07-20 MED ORDER — DOCUSATE SODIUM 100 MG PO CAPS
100.0000 mg | ORAL_CAPSULE | Freq: Two times a day (BID) | ORAL | Status: DC
  Administered 2015-07-20 – 2015-07-21 (×3): 100 mg via ORAL
  Filled 2015-07-20 (×3): qty 1

## 2015-07-20 MED ORDER — PANTOPRAZOLE SODIUM 40 MG PO TBEC
40.0000 mg | DELAYED_RELEASE_TABLET | Freq: Every day | ORAL | Status: DC
  Administered 2015-07-21: 40 mg via ORAL
  Filled 2015-07-20: qty 1

## 2015-07-20 MED ORDER — GLYCOPYRROLATE 0.2 MG/ML IJ SOLN
INTRAMUSCULAR | Status: AC
  Filled 2015-07-20: qty 1

## 2015-07-20 MED ORDER — EPHEDRINE SULFATE 50 MG/ML IJ SOLN
INTRAMUSCULAR | Status: AC
  Filled 2015-07-20: qty 1

## 2015-07-20 MED ORDER — ZOLPIDEM TARTRATE 5 MG PO TABS: 5.0000 mg | ORAL_TABLET | Freq: Every evening | ORAL | Status: DC | PRN

## 2015-07-20 MED ORDER — ONDANSETRON HCL 4 MG/2ML IJ SOLN: 4.0000 mg | Freq: Once | INTRAMUSCULAR | Status: DC | PRN

## 2015-07-20 MED ORDER — MIDAZOLAM HCL 5 MG/5ML IJ SOLN
INTRAMUSCULAR | Status: DC | PRN
  Administered 2015-07-20 (×2): 1 mg via INTRAVENOUS

## 2015-07-20 MED ORDER — METOCLOPRAMIDE HCL 5 MG/ML IJ SOLN: 5.0000 mg | Freq: Three times a day (TID) | INTRAMUSCULAR | Status: DC | PRN

## 2015-07-20 MED ORDER — OXYCODONE HCL ER 10 MG PO T12A
10.0000 mg | EXTENDED_RELEASE_TABLET | Freq: Two times a day (BID) | ORAL | Status: DC
  Administered 2015-07-20 – 2015-07-21 (×2): 10 mg via ORAL
  Filled 2015-07-20 (×2): qty 1

## 2015-07-20 MED ORDER — PROPOFOL 10 MG/ML IV BOLUS
INTRAVENOUS | Status: DC | PRN
  Administered 2015-07-20: 20 mg via INTRAVENOUS

## 2015-07-20 MED ORDER — HYDROMORPHONE HCL 1 MG/ML IJ SOLN
INTRAMUSCULAR | Status: AC
  Administered 2015-07-20: 0.5 mg via INTRAVENOUS
  Filled 2015-07-20: qty 1

## 2015-07-20 MED ORDER — OXYCODONE HCL 5 MG PO TABS
5.0000 mg | ORAL_TABLET | ORAL | Status: DC | PRN
  Administered 2015-07-20 – 2015-07-21 (×4): 10 mg via ORAL
  Filled 2015-07-20 (×4): qty 2

## 2015-07-20 MED ORDER — ACETAMINOPHEN 325 MG PO TABS: 650.0000 mg | ORAL_TABLET | Freq: Four times a day (QID) | ORAL | Status: DC | PRN

## 2015-07-20 MED ORDER — ENOXAPARIN SODIUM 30 MG/0.3ML ~~LOC~~ SOLN
30.0000 mg | Freq: Two times a day (BID) | SUBCUTANEOUS | Status: DC
  Administered 2015-07-21: 30 mg via SUBCUTANEOUS
  Filled 2015-07-20: qty 0.3

## 2015-07-20 MED ORDER — PROPOFOL 500 MG/50ML IV EMUL
INTRAVENOUS | Status: DC | PRN
  Administered 2015-07-20: 100 ug/kg/min via INTRAVENOUS

## 2015-07-20 MED ORDER — DIPHENHYDRAMINE HCL 12.5 MG/5ML PO ELIX: 12.5000 mg | ORAL_SOLUTION | ORAL | Status: DC | PRN

## 2015-07-20 MED ORDER — DEXTROSE 5 % IV SOLN
500.0000 mg | Freq: Four times a day (QID) | INTRAVENOUS | Status: DC | PRN
  Filled 2015-07-20: qty 5

## 2015-07-20 MED ORDER — ONDANSETRON HCL 4 MG/2ML IJ SOLN
INTRAMUSCULAR | Status: DC | PRN
  Administered 2015-07-20: 4 mg via INTRAVENOUS

## 2015-07-20 MED ORDER — BUPIVACAINE-EPINEPHRINE (PF) 0.25% -1:200000 IJ SOLN
INTRAMUSCULAR | Status: AC
  Filled 2015-07-20: qty 30

## 2015-07-20 MED ORDER — TRANEXAMIC ACID 1000 MG/10ML IV SOLN
1000.0000 mg | Freq: Once | INTRAVENOUS | Status: AC
  Administered 2015-07-20: 1000 mg via INTRAVENOUS
  Filled 2015-07-20: qty 10

## 2015-07-20 MED ORDER — MENTHOL 3 MG MT LOZG: 1.0000 | LOZENGE | OROMUCOSAL | Status: DC | PRN

## 2015-07-20 MED ORDER — MEPERIDINE HCL 25 MG/ML IJ SOLN: 6.2500 mg | INTRAMUSCULAR | Status: DC | PRN

## 2015-07-20 MED ORDER — FENTANYL CITRATE (PF) 100 MCG/2ML IJ SOLN
INTRAMUSCULAR | Status: DC | PRN
  Administered 2015-07-20 (×2): 50 ug via INTRAVENOUS
  Administered 2015-07-20: 100 ug via INTRAVENOUS
  Administered 2015-07-20: 50 ug via INTRAVENOUS
  Administered 2015-07-20 (×2): 25 ug via INTRAVENOUS

## 2015-07-20 MED ORDER — LORATADINE 10 MG PO TABS: 10.0000 mg | ORAL_TABLET | Freq: Every day | ORAL | Status: DC | PRN

## 2015-07-20 MED ORDER — LOSARTAN POTASSIUM-HCTZ 50-12.5 MG PO TABS: 1.0000 | ORAL_TABLET | Freq: Every day | ORAL | Status: DC

## 2015-07-20 MED ORDER — BUPIVACAINE-EPINEPHRINE (PF) 0.25% -1:200000 IJ SOLN
INTRAMUSCULAR | Status: DC | PRN
  Administered 2015-07-20: 30 mL via PERINEURAL

## 2015-07-20 MED ORDER — FENTANYL CITRATE (PF) 100 MCG/2ML IJ SOLN
INTRAMUSCULAR | Status: AC
  Filled 2015-07-20: qty 2

## 2015-07-20 MED ORDER — BUPIVACAINE LIPOSOME 1.3 % IJ SUSP
INTRAMUSCULAR | Status: DC | PRN
  Administered 2015-07-20: 20 mL

## 2015-07-20 MED ORDER — CEFAZOLIN SODIUM-DEXTROSE 2-3 GM-% IV SOLR
2.0000 g | Freq: Four times a day (QID) | INTRAVENOUS | Status: AC
  Administered 2015-07-20: 2 g via INTRAVENOUS
  Filled 2015-07-20 (×2): qty 50

## 2015-07-20 MED ORDER — ROCURONIUM BROMIDE 50 MG/5ML IV SOLN
INTRAVENOUS | Status: AC
  Filled 2015-07-20: qty 1

## 2015-07-20 MED ORDER — SODIUM CHLORIDE 0.9 % IJ SOLN
INTRAMUSCULAR | Status: DC | PRN
  Administered 2015-07-20: 20 mL via INTRAVENOUS

## 2015-07-20 MED ORDER — PHENOL 1.4 % MT LIQD: 1.0000 | OROMUCOSAL | Status: DC | PRN

## 2015-07-20 MED ORDER — SODIUM CHLORIDE 0.9 % IV SOLN
INTRAVENOUS | Status: DC
  Administered 2015-07-20: 75 mL/h via INTRAVENOUS
  Administered 2015-07-21: 02:00:00 via INTRAVENOUS

## 2015-07-20 MED ORDER — METOCLOPRAMIDE HCL 5 MG PO TABS: 5.0000 mg | ORAL_TABLET | Freq: Three times a day (TID) | ORAL | Status: DC | PRN

## 2015-07-20 MED ORDER — CELECOXIB 200 MG PO CAPS
200.0000 mg | ORAL_CAPSULE | Freq: Two times a day (BID) | ORAL | Status: DC
  Administered 2015-07-20 – 2015-07-21 (×3): 200 mg via ORAL
  Filled 2015-07-20 (×3): qty 1

## 2015-07-20 MED ORDER — HYDROMORPHONE HCL 1 MG/ML IJ SOLN
0.2500 mg | INTRAMUSCULAR | Status: DC | PRN
  Administered 2015-07-20: 0.5 mg via INTRAVENOUS

## 2015-07-20 MED ORDER — BUPIVACAINE-EPINEPHRINE (PF) 0.5% -1:200000 IJ SOLN
INTRAMUSCULAR | Status: AC
  Filled 2015-07-20: qty 30

## 2015-07-20 MED ORDER — SODIUM CHLORIDE 0.9 % IR SOLN
Status: DC | PRN
  Administered 2015-07-20 (×2): 1000 mL

## 2015-07-20 MED ORDER — SUCCINYLCHOLINE CHLORIDE 20 MG/ML IJ SOLN
INTRAMUSCULAR | Status: AC
  Filled 2015-07-20: qty 1

## 2015-07-20 MED ORDER — METHOCARBAMOL 500 MG PO TABS
500.0000 mg | ORAL_TABLET | Freq: Four times a day (QID) | ORAL | Status: DC | PRN
  Filled 2015-07-20: qty 1

## 2015-07-20 MED ORDER — HYDROMORPHONE HCL 1 MG/ML IJ SOLN
1.0000 mg | INTRAMUSCULAR | Status: DC | PRN
Start: 2015-07-20 — End: 2015-07-21
  Administered 2015-07-20: 1 mg via INTRAVENOUS
  Filled 2015-07-20: qty 1

## 2015-07-20 MED ORDER — MONTELUKAST SODIUM 10 MG PO TABS: 10.0000 mg | ORAL_TABLET | Freq: Every evening | ORAL | Status: DC | PRN

## 2015-07-20 MED ORDER — SODIUM CHLORIDE 0.9 % IJ SOLN
INTRAMUSCULAR | Status: AC
  Filled 2015-07-20: qty 20

## 2015-07-20 MED ORDER — FLEET ENEMA 7-19 GM/118ML RE ENEM: 1.0000 | ENEMA | Freq: Once | RECTAL | Status: DC | PRN

## 2015-07-20 MED ORDER — ACETAMINOPHEN 650 MG RE SUPP: 650.0000 mg | Freq: Four times a day (QID) | RECTAL | Status: DC | PRN

## 2015-07-20 MED ORDER — MIDAZOLAM HCL 2 MG/2ML IJ SOLN
INTRAMUSCULAR | Status: AC
  Filled 2015-07-20: qty 2

## 2015-07-20 MED ORDER — ONDANSETRON HCL 4 MG/2ML IJ SOLN
INTRAMUSCULAR | Status: AC
  Filled 2015-07-20: qty 2

## 2015-07-20 MED ORDER — LACTATED RINGERS IV SOLN
INTRAVENOUS | Status: DC
  Administered 2015-07-20 (×2): via INTRAVENOUS

## 2015-07-20 MED ORDER — FENTANYL CITRATE (PF) 250 MCG/5ML IJ SOLN
INTRAMUSCULAR | Status: AC
  Filled 2015-07-20: qty 5

## 2015-07-20 MED ORDER — LOSARTAN POTASSIUM 50 MG PO TABS
50.0000 mg | ORAL_TABLET | Freq: Every day | ORAL | Status: DC
  Administered 2015-07-21: 50 mg via ORAL
  Filled 2015-07-20: qty 1

## 2015-07-20 MED ORDER — SODIUM CHLORIDE 0.9 % IV SOLN: INTRAVENOUS | Status: DC

## 2015-07-20 SURGICAL SUPPLY — 60 items
BANDAGE ESMARK 6X9 LF (GAUZE/BANDAGES/DRESSINGS) ×1 IMPLANT
BLADE SAGITTAL 13X1.27X60 (BLADE) ×2 IMPLANT
BLADE SAGITTAL 13X1.27X60MM (BLADE) ×1
BLADE SAW SGTL 83.5X18.5 (BLADE) ×3 IMPLANT
BLADE SURG 10 STRL SS (BLADE) ×3 IMPLANT
BNDG ESMARK 6X9 LF (GAUZE/BANDAGES/DRESSINGS) ×3
BOWL SMART MIX CTS (DISPOSABLE) ×3 IMPLANT
CAPT KNEE TOTAL 3 ×3 IMPLANT
CEMENT BONE SIMPLEX SPEEDSET (Cement) ×6 IMPLANT
COVER SURGICAL LIGHT HANDLE (MISCELLANEOUS) ×3 IMPLANT
CUFF TOURNIQUET SINGLE 34IN LL (TOURNIQUET CUFF) ×3 IMPLANT
DRAPE EXTREMITY T 121X128X90 (DRAPE) ×3 IMPLANT
DRAPE INCISE IOBAN 66X45 STRL (DRAPES) ×6 IMPLANT
DRAPE PROXIMA HALF (DRAPES) IMPLANT
DRAPE U-SHAPE 47X51 STRL (DRAPES) ×3 IMPLANT
DRSG ADAPTIC 3X8 NADH LF (GAUZE/BANDAGES/DRESSINGS) ×3 IMPLANT
DRSG PAD ABDOMINAL 8X10 ST (GAUZE/BANDAGES/DRESSINGS) ×3 IMPLANT
DURAPREP 26ML APPLICATOR (WOUND CARE) ×6 IMPLANT
ELECT REM PT RETURN 9FT ADLT (ELECTROSURGICAL) ×3
ELECTRODE REM PT RTRN 9FT ADLT (ELECTROSURGICAL) ×1 IMPLANT
GAUZE SPONGE 4X4 12PLY STRL (GAUZE/BANDAGES/DRESSINGS) ×3 IMPLANT
GLOVE BIOGEL M 7.0 STRL (GLOVE) ×6 IMPLANT
GLOVE BIOGEL PI IND STRL 7.0 (GLOVE) ×3 IMPLANT
GLOVE BIOGEL PI IND STRL 7.5 (GLOVE) ×1 IMPLANT
GLOVE BIOGEL PI IND STRL 8.5 (GLOVE) IMPLANT
GLOVE BIOGEL PI INDICATOR 7.0 (GLOVE) ×6
GLOVE BIOGEL PI INDICATOR 7.5 (GLOVE) ×2
GLOVE BIOGEL PI INDICATOR 8.5 (GLOVE)
GLOVE SURG ORTHO 8.0 STRL STRW (GLOVE) ×6 IMPLANT
GOWN STRL REUS W/ TWL LRG LVL3 (GOWN DISPOSABLE) ×1 IMPLANT
GOWN STRL REUS W/ TWL XL LVL3 (GOWN DISPOSABLE) ×2 IMPLANT
GOWN STRL REUS W/TWL LRG LVL3 (GOWN DISPOSABLE) ×2
GOWN STRL REUS W/TWL XL LVL3 (GOWN DISPOSABLE) ×4
HANDPIECE INTERPULSE COAX TIP (DISPOSABLE) ×2
HOOD PEEL AWAY FACE SHEILD DIS (HOOD) ×9 IMPLANT
KIT BASIN OR (CUSTOM PROCEDURE TRAY) ×3 IMPLANT
KIT ROOM TURNOVER OR (KITS) ×3 IMPLANT
KNEE CAPITATED TOTAL 3 ×1 IMPLANT
MANIFOLD NEPTUNE II (INSTRUMENTS) ×3 IMPLANT
NEEDLE 22X1 1/2 (OR ONLY) (NEEDLE) ×6 IMPLANT
NS IRRIG 1000ML POUR BTL (IV SOLUTION) ×3 IMPLANT
PACK TOTAL JOINT (CUSTOM PROCEDURE TRAY) ×3 IMPLANT
PACK UNIVERSAL I (CUSTOM PROCEDURE TRAY) ×3 IMPLANT
PAD ARMBOARD 7.5X6 YLW CONV (MISCELLANEOUS) ×6 IMPLANT
PADDING CAST COTTON 6X4 STRL (CAST SUPPLIES) ×3 IMPLANT
SET HNDPC FAN SPRY TIP SCT (DISPOSABLE) ×1 IMPLANT
SPONGE GAUZE 4X4 12PLY STER LF (GAUZE/BANDAGES/DRESSINGS) ×3 IMPLANT
STAPLER VISISTAT 35W (STAPLE) ×3 IMPLANT
SUCTION FRAZIER TIP 10 FR DISP (SUCTIONS) ×3 IMPLANT
SUT BONE WAX W31G (SUTURE) ×3 IMPLANT
SUT VIC AB 0 CTB1 27 (SUTURE) ×6 IMPLANT
SUT VIC AB 1 CT1 27 (SUTURE) ×4
SUT VIC AB 1 CT1 27XBRD ANBCTR (SUTURE) ×2 IMPLANT
SUT VIC AB 2-0 CT1 27 (SUTURE) ×4
SUT VIC AB 2-0 CT1 TAPERPNT 27 (SUTURE) ×2 IMPLANT
SYR 20CC LL (SYRINGE) ×6 IMPLANT
TOWEL OR 17X24 6PK STRL BLUE (TOWEL DISPOSABLE) ×3 IMPLANT
TOWEL OR 17X26 10 PK STRL BLUE (TOWEL DISPOSABLE) ×3 IMPLANT
TRAY CATH 16FR W/PLASTIC CATH (SET/KITS/TRAYS/PACK) ×3 IMPLANT
WATER STERILE IRR 1000ML POUR (IV SOLUTION) ×6 IMPLANT

## 2015-07-20 NOTE — H&P (Signed)
Marie Fuller MRN:  [id number] DOB/SEX:  March 04, 1935/female  CHIEF COMPLAINT:  Painful right Knee  HISTORY: Patient is a 80 y.o. female presented with a history of pain in the right knee. Onset of symptoms was gradual starting several years ago with gradually worsening course since that time. Prior procedures on the knee include arthroscopy. Patient has been treated conservatively with over-the-counter NSAIDs and activity modification. Patient currently rates pain in the knee at 10 out of 10 with activity. There is pain at night.  PAST MEDICAL HISTORY: There are no active problems to display for this patient.  Past Medical History  Diagnosis Date  . Seasonal allergies   . On home oxygen therapy     2l/m at night  . Hypertension     takes Diltiazem and Hyzaar daily  . Hyperlipidemia     taken off meds by medical md 4 months ago  . Asthma     bronchial.Singulair nightly as needed along with Claritin and Nasonex in the am  . Arthritis   . Rheumatoid arthritis ([name])   . Joint pain   . Joint swelling   . Diverticulosis   . GERD (gastroesophageal reflux disease)     takes Omeprazole daily  . History of gastric ulcer   . History of kidney stones   . Cataracts, bilateral     immature   . Hard of hearing     doesn't wear hearing aides   Past Surgical History  Procedure Laterality Date  . Cardiac catherization  2002/2016  . Abdominal hysterectomy    . Appendectomy    . Cholecystectomy    . Tonsillectomy    . Skin cancer removal    . Left breast biopsy    . Fingers on right hand removed    . Colonoscopy with esophagogastroduodenoscopy (egd) and esophageal dilation (ed)    . Colonoscopy    . Lithotripsy    . Biopsy thyroid       MEDICATIONS:   No prescriptions prior to admission    ALLERGIES:  No Known Allergies  REVIEW OF SYSTEMS:  Pertinent items noted in HPI and remainder of comprehensive ROS otherwise negative.   FAMILY HISTORY:   Family History  Problem Relation  Age of Onset  . Cancer Mother   . Asthma Mother     SOCIAL HISTORY:   Social History  Substance Use Topics  . Smoking status: Never Smoker   . Smokeless tobacco: Not on file  . Alcohol Use: No     EXAMINATION:  Vital signs in last 24 hours:    General appearance: alert, cooperative and no distress Lungs: clear to auscultation bilaterally Heart: regular rate and rhythm, S1, S2 normal, no murmur, click, rub or gallop Abdomen: soft, non-tender; bowel sounds normal; no masses,  no organomegaly Extremities: extremities normal, atraumatic, no cyanosis or edema and Homans sign is negative, no sign of DVT Pulses: 2+ and symmetric Skin: Skin color, texture, turgor normal. No rashes or lesions Neurologic: Alert and oriented X 3, normal strength and tone. Normal symmetric reflexes. Normal coordination and gait  Musculoskeletal:  ROM 0-105, Ligaments intact,  Imaging Review Plain radiographs demonstrate severe degenerative joint disease of the right knee. The overall alignment is significant varus. The bone quality appears to be good for age and reported activity level.  Assessment/Plan: Primary osteoarthritis, right knee   The patient history, physical examination and imaging studies are consistent with advanced degenerative joint disease of the right knee. The patient has failed conservative treatment.  The clearance notes were reviewed.  After discussion with the patient it was felt that Total Knee Replacement was indicated. The procedure,  risks, and benefits of total knee arthroplasty were presented and reviewed. The risks including but not limited to aseptic loosening, infection, blood clots, vascular injury, stiffness, patella tracking problems complications among others were discussed. The patient acknowledged the explanation, agreed to proceed with the plan.  Stevan Eberwein 07/20/2015, 6:14 AM

## 2015-07-20 NOTE — Anesthesia Preprocedure Evaluation (Addendum)
Anesthesia Evaluation  Patient identified by MRN, date of birth, ID band Patient awake    Reviewed: Allergy & Precautions, NPO status   History of Anesthesia Complications Negative for: history of anesthetic complications  Airway Mallampati: II  TM Distance: >3 FB Neck ROM: Full    Dental  (+) Lower Dentures, Upper Dentures, Dental Advisory Given   Pulmonary asthma ,  Night O2 2L/min   Pulmonary exam normal breath sounds clear to auscultation       Cardiovascular hypertension, Pt. on medications + CAD  Normal cardiovascular exam Rhythm:Regular Rate:Normal     Neuro/Psych negative neurological ROS  negative psych ROS   GI/Hepatic Neg liver ROS, GERD  Controlled and Medicated,  Endo/Other  Morbid obesity  Renal/GU negative Renal ROS     Musculoskeletal   Abdominal   Peds  Hematology   Anesthesia Other Findings   Reproductive/Obstetrics                           Anesthesia Physical Anesthesia Plan  ASA: II  Anesthesia Plan: Spinal   Post-op Pain Management:    Induction: Intravenous  Airway Management Planned: Natural Airway  Additional Equipment:   Intra-op Plan:   Post-operative Plan:   Informed Consent: I have reviewed the patients History and Physical, chart, labs and discussed the procedure including the risks, benefits and alternatives for the proposed anesthesia with the patient or authorized representative who has indicated his/her understanding and acceptance.     Plan Discussed with: CRNA and Surgeon  Anesthesia Plan Comments:         Anesthesia Quick Evaluation

## 2015-07-20 NOTE — Anesthesia Postprocedure Evaluation (Signed)
Anesthesia Post Note  Patient: Marie Fuller  Procedure(s) Performed: Procedure(s) (LRB): TOTAL KNEE ARTHROPLASTY (Right)  Patient location during evaluation: PACU Anesthesia Type: Spinal and MAC Level of consciousness: awake and alert Pain management: pain level controlled Vital Signs Assessment: post-procedure vital signs reviewed and stable Respiratory status: spontaneous breathing and respiratory function stable Cardiovascular status: blood pressure returned to baseline and stable Postop Assessment: spinal receding Anesthetic complications: no    Last Vitals:  Filed Vitals:   07/20/15 1240 07/20/15 1257  BP: 131/73 124/71  Pulse: 86 90  Temp: 36.9 C 36.7 C  Resp: 15 16    Last Pain:  Filed Vitals:   07/20/15 1258  PainSc: 3                  Dylyn Mclaren DAVID

## 2015-07-20 NOTE — Progress Notes (Signed)
Utilization review completed.  

## 2015-07-20 NOTE — Anesthesia Procedure Notes (Addendum)
Spinal Patient location during procedure: OR Start time: 07/20/2015 8:50 AM End time: 07/20/2015 8:55 AM Staffing Anesthesiologist: Lillia Abed Performed by: anesthesiologist  Preanesthetic Checklist Completed: patient identified, site marked, surgical consent, pre-op evaluation, timeout performed, IV checked, risks and benefits discussed and monitors and equipment checked Spinal Block Patient position: sitting Prep: Betadine Patient monitoring: heart rate, cardiac monitor, continuous pulse ox and blood pressure Approach: midline Location: L3-4 Injection technique: single-shot Needle Needle type: Pencan  Needle gauge: 24 G Needle length: 9 cm Needle insertion depth: 5 cm  Procedure Name: MAC Date/Time: 07/20/2015 8:55 AM Performed by: Jenne Campus Pre-anesthesia Checklist: Patient identified, Emergency Drugs available, Suction available, Patient being monitored and Timeout performed Patient Re-evaluated:Patient Re-evaluated prior to inductionOxygen Delivery Method: Simple face mask

## 2015-07-20 NOTE — Transfer of Care (Signed)
Immediate Anesthesia Transfer of Care Note  Patient: Marie Fuller  Procedure(s) Performed: Procedure(s): TOTAL KNEE ARTHROPLASTY (Right)  Patient Location: PACU  Anesthesia Type:MAC and Spinal  Level of Consciousness: awake, alert , oriented and patient cooperative  Airway & Oxygen Therapy: Patient Spontanous Breathing and Patient connected to nasal cannula oxygen  Post-op Assessment: Report given to RN and Post -op Vital signs reviewed and stable  Post vital signs: Reviewed  Last Vitals:  Filed Vitals:   07/20/15 0704  BP: 159/87  Pulse: 94  Temp: 36.4 C  Resp: 18    Complications: No apparent anesthesia complications

## 2015-07-20 NOTE — Evaluation (Signed)
Physical Therapy Evaluation Patient Details Name: Marie Fuller MRN: [id number] DOB: 09/12/1934 Today's Date: 07/20/2015   History of Present Illness  80 y.o. female admitted to [facility] on 07/20/15 for elective R TKA.  Pt with significant PMHx of Fingers on R hand removed, on Home O2 2 LO2 at night, HTN, RA, and HOH.  Clinical Impression  Pt is POD #0 and is moving very well, min guard assist in room with RW.  Education re HEP and knee precautions initiated with pt and family.  I anticipate she will do well enough to d/c home with family's assist at discharge (plan is after second therapy session tomorrow).   PT to follow acutely for deficits listed below.      Follow Up Recommendations Home health PT;Supervision for mobility/OOB    Equipment Recommendations  None recommended by PT    Recommendations for Other Services   NA    Precautions / Restrictions Precautions Precautions: Knee Precaution Booklet Issued: Yes (comment) Precaution Comments: handout given and precautions/WB status reviewed Restrictions Weight Bearing Restrictions: Yes RLE Weight Bearing: Weight bearing as tolerated      Mobility  Bed Mobility Overal bed mobility: Needs Assistance Bed Mobility: Supine to Sit     Supine to sit: Min guard     General bed mobility comments: min guard assist to help progress right leg to EOB.  HOB elevated and bed rails used for leverage at trunk.   Transfers Overall transfer level: Needs assistance Equipment used: Rolling walker (2 wheeled) Transfers: Sit to/from Stand Sit to Stand: Min guard         General transfer comment: min guard assist for safety on first stand.  Stood from bed and elevated 3-in- 1 in bathroom.  Verbal cues for safe hand placement and transitions.   Ambulation/Gait Ambulation/Gait assistance: Min guard Ambulation Distance (Feet): 15 Feet Assistive device: Rolling walker (2 wheeled) Gait Pattern/deviations: Step-to pattern;Antalgic     General  Gait Details: Pt moves quickly and needs frequent reminders re: RW proximity and correct LE sequencing.           Balance Overall balance assessment: Needs assistance Sitting-balance support: Feet supported;No upper extremity supported Sitting balance-Leahy Scale: Good     Standing balance support: Bilateral upper extremity supported;Single extremity supported;No upper extremity supported Standing balance-Leahy Scale: Fair                               Pertinent Vitals/Pain Pain Assessment: 0-10 Pain Score: 5  Pain Location: right knee Pain Descriptors / Indicators: Aching Pain Intervention(s): Limited activity within patient's tolerance;Monitored during session;Repositioned    Home Living Family/patient expects to be discharged to:: Private residence Living Arrangements: Spouse/significant other Available Help at Discharge: Family;Available 24 hours/day (husband has parkinson's, sister and other family covering) Type of Home: House Home Access: Stairs to enter Entrance Stairs-Rails: Psychiatric nurse of Steps: 3 Home Layout: One level Home Equipment: Environmental consultant - 2 wheels;Bedside commode;Shower seat      Prior Function Level of Independence: Independent         Comments: drives, 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  At least 3/5 ankle, 2+/5 knee, 2+/5  hip    Cervical / Trunk Assessment: Normal  Communication   Communication: No difficulties  Cognition Arousal/Alertness: Awake/alert Behavior During Therapy: WFL for tasks assessed/performed Overall Cognitive Status: Within Functional Limits for tasks assessed                         Exercises Total Joint Exercises Ankle Circles/Pumps:  AROM;Both;20 reps;Seated Quad Sets: AROM;Right;10 reps;Seated Towel Squeeze: AROM;Both;10 reps;Seated Heel Slides: AAROM;Right;10 reps;Seated      Assessment/Plan    PT Assessment Patient needs continued PT services  PT Diagnosis Difficulty walking;Abnormality of gait;Generalized weakness;Acute pain   PT Problem List Decreased strength;Decreased activity tolerance;Decreased range of motion;Decreased balance;Decreased mobility;Decreased knowledge of use of DME;Pain  PT Treatment Interventions DME instruction;Gait training;Stair training;Functional mobility training;Therapeutic activities;Therapeutic exercise;Balance training;Manual techniques;Modalities   PT Goals (Current goals can be found in the Care Plan section) Acute Rehab PT Goals Patient Stated Goal: to go home at discharge PT Goal Formulation: With patient Time For Goal Achievement: 07/27/15 Potential to Achieve Goals: Good    Frequency 7X/week           End of Session Equipment Utilized During Treatment: Gait belt Activity Tolerance: Patient limited by pain Patient left: in chair;with call bell/phone within reach;with family/visitor present           Time: 1633-1700 PT Time Calculation (min) (ACUTE ONLY): 27 min   Charges:   PT Evaluation $Initial PT Evaluation Tier I: 1 Procedure PT Treatments $Therapeutic Activity: 8-22 mins        Freyja Govea B. Betzabe Bevans, PT, DPT [phone]   07/20/2015, 5:03 PM

## 2015-07-21 ENCOUNTER — Encounter (HOSPITAL_COMMUNITY): Payer: Self-pay | Admitting: Orthopedic Surgery

## 2015-07-21 LAB — BASIC METABOLIC PANEL
Anion gap: 7 (ref 5–15)
CALCIUM: 9.1 mg/dL (ref 8.9–10.3)
CO2: 26 mmol/L (ref 22–32)
CREATININE: 0.65 mg/dL (ref 0.44–1.00)
Chloride: 101 mmol/L (ref 101–111)
GFR calc non Af Amer: >60 mL/min (ref >60)
Glucose, Bld: 120 mg/dL — ABNORMAL HIGH (ref 65–99)
Potassium: 4.2 mmol/L (ref 3.5–5.1)
SODIUM: 134 mmol/L — AB (ref 135–145)

## 2015-07-21 LAB — CBC
HCT: 32 % — ABNORMAL LOW (ref 36.0–46.0)
Hemoglobin: 10.2 g/dL — ABNORMAL LOW (ref 12.0–15.0)
MCH: 29.2 pg (ref 26.0–34.0)
MCHC: 31.9 g/dL (ref 30.0–36.0)
MCV: 91.7 fL (ref 78.0–100.0)
Platelets: 152 10*3/uL (ref 150–400)
RBC: 3.49 MIL/uL — ABNORMAL LOW (ref 3.87–5.11)
RDW: 14.4 % (ref 11.5–15.5)
WBC: 6.4 10*3/uL (ref 4.0–10.5)

## 2015-07-21 MED ORDER — OXYCODONE HCL ER 10 MG PO T12A: 10.0000 mg | EXTENDED_RELEASE_TABLET | Freq: Two times a day (BID) | ORAL | Status: DC

## 2015-07-21 MED ORDER — OXYCODONE HCL 5 MG PO TABS: 5.0000 mg | ORAL_TABLET | ORAL | Status: DC | PRN

## 2015-07-21 MED ORDER — METOCLOPRAMIDE HCL 5 MG PO TABS: 5.0000 mg | ORAL_TABLET | Freq: Three times a day (TID) | ORAL | Status: DC | PRN

## 2015-07-21 MED ORDER — ENOXAPARIN SODIUM 40 MG/0.4ML ~~LOC~~ SOLN: 40.0000 mg | SUBCUTANEOUS | Status: DC

## 2015-07-21 MED ORDER — CELECOXIB 200 MG PO CAPS: 200.0000 mg | ORAL_CAPSULE | Freq: Two times a day (BID) | ORAL | Status: DC

## 2015-07-21 MED ORDER — METHOCARBAMOL 500 MG PO TABS: 500.0000 mg | ORAL_TABLET | Freq: Four times a day (QID) | ORAL | Status: DC | PRN

## 2015-07-21 NOTE — Progress Notes (Signed)
SPORTS MEDICINE AND JOINT REPLACEMENT  Marie Mulch, MD   Carlynn Spry, PA-C [place], [place], [place]  [address]                             [phone]   PROGRESS NOTE  Subjective:  negative for Chest Pain  negative for Shortness of Breath  positive for Nausea/Vomiting   negative for Calf Pain  negative for Bowel Movement   Tolerating Diet: yes         Patient reports pain as 7 on 0-10 scale.    Objective: Vital signs in last 24 hours:   Patient Vitals for the past 24 hrs:  BP Temp Temp src Pulse Resp SpO2  07/21/15 0524 125/72 mmHg 98.4 F (36.9 C) Oral 72 15 98 %  07/21/15 0148 114/69 mmHg 99 F (37.2 C) Oral 77 16 96 %  07/20/15 1956 134/78 mmHg 99.3 F (37.4 C) Oral (!) 104 16 94 %  07/20/15 1257 124/71 mmHg 98.1 F (36.7 C) Oral 90 16 97 %  07/20/15 1240 131/73 mmHg 98.4 F (36.9 C) - 86 15 95 %  07/20/15 1239 131/73 mmHg - - 86 - 98 %  07/20/15 1225 128/79 mmHg - - 78 14 95 %  07/20/15 1210 138/78 mmHg - - 87 (!) 21 96 %  07/20/15 1155 (!) 143/72 mmHg - - 86 19 94 %  07/20/15 1140 138/76 mmHg - - 82 12 95 %  07/20/15 1125 (!) 119/55 mmHg - - 79 13 96 %  07/20/15 1110 (!) 144/76 mmHg - - 85 (!) 21 93 %  07/20/15 1055 - 98.2 F (36.8 C) - - - -    @flow {1959:LAST@   Intake/Output from previous day:   12/05 0701 - 12/06 0700 In: 1120 [P.O.:120; I.V.:1000] Out: 150 [Urine:150]   Intake/Output this shift:       Intake/Output      12/05 0701 - 12/06 0700 12/06 0701 - 12/07 0700   P.O. 120    I.V. (mL/kg) 1000 (13.5)    Total Intake(mL/kg) 1120 (15.1)    Urine (mL/kg/hr) 150 (0.1)    Total Output 150     Net +970          Urine Occurrence 3 x       LABORATORY DATA:  Recent Labs  07/20/15 1535  WBC 7.4  HGB 11.4*  HCT 36.5  PLT 176    Recent Labs  07/20/15 1535  CREATININE 0.76   Lab Results  Component Value Date   INR 0.97 07/10/2015    Examination:  General appearance: alert, cooperative and no  distress Extremities: Homans sign is negative, no sign of DVT  Wound Exam: clean, dry, intact   Drainage:  Scant/small amount Serosanguinous exudate  Motor Exam: Quadriceps and Hamstrings Intact  Sensory Exam: Deep Peroneal normal   Assessment:    1 Day Post-Op  Procedure(s) (LRB): TOTAL KNEE ARTHROPLASTY (Right)  ADDITIONAL DIAGNOSIS:  Active Problems:   S/P total knee arthroplasty  Acute Blood Loss Anemia   Plan: Physical Therapy as ordered Weight Bearing as Tolerated (WBAT)  DVT Prophylaxis:  Lovenox  DISCHARGE PLAN: Home  DISCHARGE NEEDS: HHPT, CPM, Walker and 3-in-1 comode seat         Hyder Deman 07/21/2015, 7:13 AM

## 2015-07-21 NOTE — Discharge Instructions (Signed)

## 2015-07-21 NOTE — Op Note (Signed)
TOTAL KNEE REPLACEMENT OPERATIVE NOTE:  07/20/2015  11:44 AM  PATIENT:  Marie Fuller  80 y.o. female  PRE-OPERATIVE DIAGNOSIS:  primary osteoarthritis right knee  POST-OPERATIVE DIAGNOSIS:  primary osteoarthritis right knee  PROCEDURE:  Procedure(s): TOTAL KNEE ARTHROPLASTY  SURGEON:  Surgeon(s): Vickey Huger, MD  PHYSICIAN ASSISTANT: Carlynn Spry, [facility]  ANESTHESIA:   spinal  DRAINS: Hemovac  SPECIMEN: None  COUNTS:  Correct  TOURNIQUET:   Total Tourniquet Time Documented: Thigh (Right) - 44 minutes Total: Thigh (Right) - 44 minutes   DICTATION:  Indication for procedure:    The patient is a 80 y.o. female who has failed conservative treatment for primary osteoarthritis right knee.  Informed consent was obtained prior to anesthesia. The risks versus benefits of the operation were explain and in a way the patient can, and did, understand.   On the implant demand matching protocol, this patient scored 9.  Therefore, this patient did" "did not receive a polyethylene insert with vitamin E which is a high demand implant.  Description of procedure:     The patient was taken to the operating room and placed under anesthesia.  The patient was positioned in the usual fashion taking care that all body parts were adequately padded and/or protected.  I foley catheter was not placed.  A tourniquet was applied and the leg prepped and draped in the usual sterile fashion.  The extremity was exsanguinated with the esmarch and tourniquet inflated to 350 mmHg.  Pre-operative range of motion was normal.  The knee was in 5 degree of mild varus.  A midline incision approximately 6-7 inches long was made with a #10 blade.  A new blade was used to make a parapatellar arthrotomy going 2-3 cm into the quadriceps tendon, over the patella, and alongside the medial aspect of the patellar tendon.  A synovectomy was then performed with the #10 blade and forceps. I then elevated the deep MCL off the  medial tibial metaphysis subperiosteally around to the semimembranosus attachment.    I everted the patella and used calipers to measure patellar thickness.  I used the reamer to ream down to appropriate thickness to recreate the native thickness.  I then removed excess bone with the rongeur and sagittal saw.  I used the appropriately sized template and drilled the three lug holes.  I then put the trial in place and measured the thickness with the calipers to ensure recreation of the native thickness.  The trial was then removed and the patella subluxed and the knee brought into flexion.  A homan retractor was place to retract and protect the patella and lateral structures.  A Z-retractor was place medially to protect the medial structures.  The extra-medullary alignment system was used to make cut the tibial articular surface perpendicular to the anamotic axis of the tibia and in 3 degrees of posterior slope.  The cut surface and alignment jig was removed.  I then used the intramedullary alignment guide to make a 6 valgus cut on the distal femur.  I then marked out the epicondylar axis on the distal femur.  The posterior condylar axis measured 3 degrees.  I then used the anterior referencing sizer and measured the femur to be a size 6.  The 4-In-1 cutting block was screwed into place in external rotation matching the posterior condylar angle, making our cuts perpendicular to the epicondylar axis.  Anterior, posterior and chamfer cuts were made with the sagittal saw.  The cutting block and cut pieces were  removed.  A lamina spreader was placed in 90 degrees of flexion.  The ACL, PCL, menisci, and posterior condylar osteophytes were removed.  A 11 mm spacer blocked was found to offer good flexion and extension gap balance after mild in degree releasing.   The scoop retractor was then placed and the femoral finishing block was pinned in place.  The small sagittal saw was used as well as the lug drill to finish  the femur.  The block and cut surfaces were removed and the medullary canal hole filled with autograft bone from the cut pieces.  The tibia was delivered forward in deep flexion and external rotation.  A size C tray was selected and pinned into place centered on the medial 1/3 of the tibial tubercle.  The reamer and keel was used to prepare the tibia through the tray.    I then trialed with the size 6 femur, size C tibia, a 11 mm insert and the 32 patella.  I had excellent flexion/extension gap balance, excellent patella tracking.  Flexion was full and beyond 120 degrees; extension was zero.  These components were chosen and the staff opened them to me on the back table while the knee was lavaged copiously and the cement mixed.  The soft tissue was infiltrated with 60cc of exparel 1.3% through a 21 gauge needle.  I cemented in the components and removed all excess cement.  The polyethylene tibial component was snapped into place and the knee placed in extension while cement was hardening.  The capsule was infilltrated with 30cc of .25% Marcaine with epinephrine.  A hemovac was place in the joint exiting superolaterally.  A pain pump was place superomedially superficial to the arthrotomy.  Once the cement was hard, the tourniquet was let down.  Hemostasis was obtained.  The arthrotomy was closed with figure-8 #1 vicryl sutures.  The deep soft tissues were closed with #0 vicryls and the subcuticular layer closed with a running #2-0 vicryl.  The skin was reapproximated and closed with skin staples.  The wound was dressed with xeroform, 4 x4's, 2 ABD sponges, a single layer of webril and a TED stocking.   The patient was then awakened, extubated, and taken to the recovery room in stable condition.  BLOOD LOSS:  300cc DRAINS: 1 hemovac, 1 pain catheter COMPLICATIONS:  None.  PLAN OF CARE: Admit to inpatient   PATIENT DISPOSITION:  PACU - hemodynamically stable.   Delay start of Pharmacological VTE agent  (>24hrs) due to surgical blood loss or risk of bleeding:  not applicable  Please fax a copy of this op note to my office at [phone] (please only include page 1 and 2 of the Case Information op note)

## 2015-07-21 NOTE — Care Management Note (Signed)
Case Management Note  Patient Details  Name: Marie Fuller MRN: [id number] Date of Birth: 10/31/1934  Subjective/Objective:         S/p right total knee arthroplasty           Action/Plan: Set up with [facility] for HHPT by MD office. Spoke with patient, no change in discharge plan. Contacted Inez Catalina at [facility], verified that they will start service 07/22/15 if patient discharges today. Faxed the PT order, face to face, demographics, H and P and PT eval to [phone] and received confirmation. Patient states that the CPM and rolling walker have been delivered to her home and she already had 3N1. Patient states that her husband will be able to assist her after discharge.     Expected Discharge Date:                  Expected Discharge Plan:  [place]  In-House Referral:  NA  Discharge planning Services  CM Consult  Post Acute Care Choice:  Durable Medical Equipment, Home Health Choice offered to:  Patient  DME Arranged:  CPM, Walker rolling DME Agency:  [facility]  HH Arranged:  PT [place]:  [facility]  Status of Service:  Completed, signed off  Medicare Important Message Given:    Date Medicare IM Given:    Medicare IM give by:    Date Additional Medicare IM Given:    Additional Medicare Important Message give by:     If discussed at [place] of Stay Meetings, dates discussed:    Additional Comments:  Nila Nephew, RN 07/21/2015, 10:52 AM

## 2015-07-21 NOTE — Progress Notes (Signed)
Occupational Therapy Evaluation Patient Details Name: Marie Fuller MRN: [id number] DOB: 1935-03-11 Today's Date: 07/21/2015    History of Present Illness 80 y.o. female admitted to [facility] on 07/20/15 for elective R TKA.  Pt with significant PMHx of Fingers on R hand removed, on Home O2 2 LO2 at night, HTN, RA, and HOH.   Clinical Impression   Pt admitted with the above diagnoses and presents with below problem list. Pt will benefit from continued acute OT to address the below listed deficits and maximize independence with BADLs prior to d/c home with family assisting. PTA pt was independent with ADLs. Pt is currently setup to min guard with ADLs. Pt awaiting d/c at time of OT evaluation. ADL education completed. No further acute OT needs indicated. OT signing off.      Follow Up Recommendations  No OT follow up;Supervision/Assistance - 24 hour    Equipment Recommendations  None recommended by OT    Recommendations for Other Services       Precautions / Restrictions Precautions Precautions: Knee Precaution Comments: reviewed Restrictions Weight Bearing Restrictions: Yes RLE Weight Bearing: Weight bearing as tolerated      Mobility Bed Mobility Overal bed mobility: Needs Assistance Bed Mobility: Supine to Sit     Supine to sit: Supervision     General bed mobility comments: received in recliner   Transfers Overall transfer level: Needs assistance Equipment used: Rolling walker (2 wheeled) Transfers: Sit to/from Stand Sit to Stand: Supervision         General transfer comment: Cues for position of rw directly in front during stand>sit.    Balance Overall balance assessment: Needs assistance Sitting-balance support: No upper extremity supported;Feet supported Sitting balance-Leahy Scale: Good     Standing balance support: Bilateral upper extremity supported;During functional activity Standing balance-Leahy Scale: Fair                               ADL Overall ADL's : Needs assistance/impaired Eating/Feeding: Set up;Sitting   Grooming: Set up;Sitting;Min guard;Standing   Upper Body Bathing: Set up;Sitting   Lower Body Bathing: Min guard;Sit to/from stand   Upper Body Dressing : Set up;Sitting   Lower Body Dressing: Min guard;Sit to/from stand   Toilet Transfer: Min guard;Ambulation;RW (3n1 over toilet)   Toileting- Clothing Manipulation and Hygiene: Min guard;Sit to/from stand;Sitting/lateral lean   Tub/ Shower Transfer: Min guard;Ambulation;Shower Technical sales engineer Details (indicate cue type and reason): discussed shower seat setup and transfer at home including need for someone to be with her during shower transfer Functional mobility during ADLs: Min guard;Rolling walker General ADL Comments: Pt completed in-room functional mobility and toilet transfer. Able to reach straight down leg to access feet in sitting position. ADL education given.      Vision     Perception     Praxis      Pertinent Vitals/Pain Pain Assessment: 0-10 Pain Score:  ("it's not bad" no numerical value given) Pain Location: R knee Pain Descriptors / Indicators: Aching;Sore Pain Intervention(s): Monitored during session;Repositioned     Hand Dominance Left   Extremity/Trunk Assessment Upper Extremity Assessment Upper Extremity Assessment: Overall WFL for tasks assessed   Lower Extremity Assessment Lower Extremity Assessment: Defer to PT evaluation   Cervical / Trunk Assessment Cervical / Trunk Assessment: Normal   Communication Communication Communication: No difficulties   Cognition Arousal/Alertness: Awake/alert Behavior During Therapy: WFL for tasks assessed/performed Overall Cognitive Status: Within Functional Limits for  tasks assessed                     General Comments       Exercises     Shoulder Instructions      Home Living Family/patient expects to be discharged to:: Private  residence Living Arrangements: Spouse/significant other Available Help at Discharge: Family;Available 24 hours/day (husband has parkinson's, sister and other family covering)) Type of Home: House Home Access: Stairs to enter [employer] of Steps: 3 Entrance Stairs-Rails: Right;Left Home Layout: One level     Bathroom Shower/Tub: Corporate investment banker: Handicapped height (sink next to the commode) Bathroom Accessibility: Yes   Home Equipment: Environmental consultant - 2 wheels;Bedside commode;Shower seat          Prior Functioning/Environment Level of Independence: Independent        Comments: drives, does not work    OT Diagnosis: Acute pain   OT Problem List:     OT Treatment/Interventions:      OT Goals(Current goals can be found in the care plan section) Acute Rehab OT Goals Patient Stated Goal: Get back to being involved in church  OT Frequency:     Barriers to D/C:            Co-evaluation              End of Session Equipment Utilized During Treatment: Rolling walker  Activity Tolerance: Patient tolerated treatment well Patient left: in chair;with call bell/phone within reach;with family/visitor present   Time: 1318-1330 OT Time Calculation (min): 12 min Charges:  OT General Charges $OT Visit: 1 Procedure OT Evaluation $Initial OT Evaluation Tier I: 1 Procedure G-Codes:    Marie Fuller July 23, 2015, 1:40 PM

## 2015-07-21 NOTE — Progress Notes (Signed)
Physical Therapy Treatment Patient Details Name: Marie Fuller MRN: [id number] DOB: February 27, 1935 Today's Date: 07/21/2015    History of Present Illness 80 y.o. female admitted to [facility] on 07/20/15 for elective R TKA.  Pt with significant PMHx of Fingers on R hand removed, on Home O2 2 LO2 at night, HTN, RA, and HOH.    PT Comments    Pt was up in the bathroom upon entering room for treatment; pt was informed to always have a nurse when up and moving in the room. Pt stated that she was in tears yesterday due to the zero degree knee foam after being in it for an hour; Pt was educated on the importance of being in the foam to help promote full knee extention. Pt will perform stairs during afternoon treatment to prepare her to safely discharge to home.  Follow Up Recommendations  Home health PT;Supervision for mobility/OOB     Equipment Recommendations  None recommended by PT       Precautions / Restrictions Precautions Precautions: Knee Restrictions Weight Bearing Restrictions: Yes RLE Weight Bearing: Weight bearing as tolerated    Mobility  Bed Mobility Overal bed mobility: Needs Assistance Bed Mobility: Supine to Sit     Supine to sit: Supervision     General bed mobility comments: Pt required supervision with bed mobility for safety. Pt was able to advance RLE off of bed independently.  Transfers Overall transfer level: Needs assistance Equipment used: Rolling walker (2 wheeled) Transfers: Sit to/from Stand Sit to Stand: Min guard         General transfer comment: min guard for safety and well as blocking RW. Pt required verbal cuing to push up on steady surface instead of pulling up on walker to increase pt safety.  Ambulation/Gait Ambulation/Gait assistance: Supervision Ambulation Distance (Feet): 65 Feet Assistive device: Rolling walker (2 wheeled) Gait Pattern/deviations: Step-to pattern;Trunk flexed;Antalgic Gait velocity: decreased Gait velocity interpretation:  <1.8 ft/sec, indicative of risk for recurrent falls General Gait Details: Pt was much less impulsive today with gait training. Pt required supervision to maintain pt safety. Pt required verbal cuing to maintain upright posture, heel-toe gait sequence and to roll RW instead of picking it up when walking.              Cognition Arousal/Alertness: Awake/alert Behavior During Therapy: WFL for tasks assessed/performed Overall Cognitive Status: Within Functional Limits for tasks assessed                      Exercises Total Joint Exercises Ankle Circles/Pumps: AROM;Both;20 reps;Seated Quad Sets: AROM;Right;10 reps;Seated Short Arc Quad: AROM;Right;10 reps;Supine (HOB elevated) Heel Slides: AAROM;Right;10 reps;Seated Straight Leg Raises: AROM;Right;10 reps;Supine (HOB elevated. VC to perform QS before raising RLE)        Pertinent Vitals/Pain Pain Assessment: 0-10 Pain Score: 5  Pain Location: R knee Pain Descriptors / Indicators: Sore Pain Intervention(s): Monitored during session           PT Goals (current goals can now be found in the care plan section) Progress towards PT goals: Progressing toward goals    Frequency  7X/week    PT Plan Current plan remains appropriate       End of Session Equipment Utilized During Treatment: Gait belt Activity Tolerance: Patient tolerated treatment well;Patient limited by fatigue Patient left: in chair;with call bell/phone within reach     Time: 0920-0947 PT Time Calculation (min) (ACUTE ONLY): 27 min  Charges:    1 Gait 1 TE  Sundra Aland, [place] OFFICE  07/21/2015, 10:27 AM

## 2015-07-21 NOTE — Progress Notes (Signed)
Physical Therapy Treatment Patient Details Name: Marie Fuller MRN: [id number] DOB: 04-Oct-1934 Today's Date: 07/21/2015    History of Present Illness 80 y.o. female admitted to [facility] on 07/20/15 for elective R TKA.  Pt with significant PMHx of Fingers on R hand removed, on Home O2 2 LO2 at night, HTN, RA, and HOH.    PT Comments    Excellent progress with functional mobility; Stair training complete, and gait is more than adequate for safe dc home; Needs continued encouragement to stay in bonefoam for optimal healing;   OK for dc home from PT standpoint   Follow Up Recommendations  Home health PT;Supervision for mobility/OOB     Equipment Recommendations  None recommended by PT    Recommendations for Other Services       Precautions / Restrictions Precautions Precautions: Knee Restrictions Weight Bearing Restrictions: Yes RLE Weight Bearing: Weight bearing as tolerated    Mobility  Bed Mobility Overal bed mobility: Needs Assistance Bed Mobility: Supine to Sit     Supine to sit: Supervision     General bed mobility comments: Pt required supervision with bed mobility for safety. Pt was able to advance RLE off of bed independently.  Transfers Overall transfer level: Needs assistance Equipment used: Rolling walker (2 wheeled) Transfers: Sit to/from Stand Sit to Stand: Supervision         General transfer comment: Cues for safety and hand placement  Ambulation/Gait Ambulation/Gait assistance: Supervision Ambulation Distance (Feet): 200 Feet Assistive device: Rolling walker (2 wheeled) Gait Pattern/deviations: Step-through pattern Gait velocity: decreased Gait velocity interpretation: <1.8 ft/sec, indicative of risk for recurrent falls General Gait Details: Managing well, nice stable knee in stance   Stairs Stairs: Yes Stairs assistance: Min guard Stair Management: One rail Left;Forwards;Step to pattern Number of Stairs: 5 General stair comments: verbal and  demo cues for sequence; managed stairs quite well  Wheelchair Mobility    Modified Rankin (Stroke Patients Only)       Balance     Sitting balance-Leahy Scale: Good       Standing balance-Leahy Scale: Fair                      Cognition Arousal/Alertness: Awake/alert Behavior During Therapy: WFL for tasks assessed/performed Overall Cognitive Status: Within Functional Limits for tasks assessed                      Exercises Total Joint Exercises Ankle Circles/Pumps: AROM;Both;20 reps;Seated Quad Sets: AROM;Right;10 reps;Seated Short Arc Quad: AROM;Right;10 reps;Supine (HOB elevated) Heel Slides: AAROM;Right;10 reps;Seated Straight Leg Raises: AROM;Right;10 reps;Supine (HOB elevated. VC to perform QS before raising RLE)    General Comments        Pertinent Vitals/Pain Pain Assessment: 0-10 Pain Score: 2  Pain Location: R knee Pain Descriptors / Indicators: Aching;Sore Pain Intervention(s): Monitored during session    Home Living                      Prior Function            PT Goals (current goals can now be found in the care plan section) Acute Rehab PT Goals Patient Stated Goal: Get back to being involved in church PT Goal Formulation: With patient Time For Goal Achievement: 07/27/15 Potential to Achieve Goals: Good Progress towards PT goals: Progressing toward goals    Frequency  7X/week    PT Plan Current plan remains appropriate    Co-evaluation  End of Session Equipment Utilized During Treatment: Gait belt Activity Tolerance: Patient tolerated treatment well Patient left: in chair;with call bell/phone within reach;with family/visitor present     Time: [id number] PT Time Calculation (min) (ACUTE ONLY): 24 min  Charges:  $Gait Training: 23-37 mins $Therapeutic Exercise: 8-22 mins                    G Codes:      Quin Hoop 07/21/2015, 12:59 PM  Roney Marion, [place] Pager [phone] Office [phone]

## 2015-08-03 NOTE — Discharge Summary (Signed)
SPORTS MEDICINE & JOINT REPLACEMENT   Lara Mulch, MD   Carlynn Spry, PA-C [place], [place],   [address]                             [phone]  PATIENT ID: Marie Fuller        MRN:  [id number]          DOB/AGE: May 06, 1935 / 80 y.o.    DISCHARGE SUMMARY  ADMISSION DATE:    07/20/2015 DISCHARGE DATE:  07/21/2015  ADMISSION DIAGNOSIS: primary osteoarthritis right knee    DISCHARGE DIAGNOSIS:  primary osteoarthritis right knee    ADDITIONAL DIAGNOSIS: Active Problems:   S/P total knee arthroplasty  Past Medical History  Diagnosis Date  . Seasonal allergies   . On home oxygen therapy     2l/m at night  . Hypertension     takes Diltiazem and Hyzaar daily  . Hyperlipidemia     taken off meds by medical md 4 months ago  . Asthma     bronchial.Singulair nightly as needed along with Claritin and Nasonex in the am  . Arthritis   . Rheumatoid arthritis ([place])   . Joint pain   . Joint swelling   . Diverticulosis   . GERD (gastroesophageal reflux disease)     takes Omeprazole daily  . History of gastric ulcer   . History of kidney stones   . Cataracts, bilateral     immature   . Hard of hearing     doesn't wear hearing aides    PROCEDURE: Procedure(s): TOTAL KNEE ARTHROPLASTY on 07/20/2015  CONSULTS:     HISTORY:  See H&P in chart  HOSPITAL COURSE:  Marie Fuller is a 80 y.o. admitted on 07/20/2015 and found to have a diagnosis of primary osteoarthritis right knee.  After appropriate laboratory studies were obtained  they were taken to the operating room on 07/20/2015 and underwent Procedure(s): TOTAL KNEE ARTHROPLASTY.   They were given perioperative antibiotics:  Anti-infectives    Start     Dose/Rate Route Frequency Ordered Stop   07/20/15 1430  ceFAZolin (ANCEF) IVPB 2 g/50 mL premix     2 g 100 mL/hr over 30 Minutes Intravenous Every 6 hours 07/20/15 1333 07/21/15 0229   07/20/15 0830  ceFAZolin (ANCEF) IVPB 2 g/50 mL premix     2 g 100  mL/hr over 30 Minutes Intravenous To ShortStay Surgical 07/19/15 1519 07/20/15 0915    .  Tolerated the procedure well.  Placed with a foley intraoperatively.   POD# 1: Vital signs were stable.  Patient denied Chest pain, shortness of breath, or calf pain.  Patient was started on Lovenox 30 mg subcutaneously twice daily at 8am.  Consults to PT, OT, and care management were made.  The patient was weight bearing as tolerated.  CPM was placed on the operative leg 0-90 degrees for 6-8 hours a day.  Incentive spirometry was taught.  Dressing was changed.        Continued  PT for ambulation and exercise program.  IV saline locked.  O2 discontinued.    The remainder of the hospital course was dedicated to ambulation and strengthening.   The patient was discharged on day 1 post op in  Good condition.  Blood products given:none  DIAGNOSTIC STUDIES: Recent vital signs: No data found.      Recent laboratory studies: No results for input(s): WBC, HGB, HCT, PLT in the  last 168 hours. No results for input(s): NA, K, CL, CO2, BUN, CREATININE, GLUCOSE, CALCIUM in the last 168 hours. Lab Results  Component Value Date   INR 0.97 07/10/2015     Recent Radiographic Studies :  No results found.  DISCHARGE INSTRUCTIONS: Discharge Instructions    CPM    Complete by:  As directed   Continuous passive motion machine (CPM):      Use the CPM from 0 to 90 for 6-8 hours per day.      You may increase by 10 per day.  You may break it up into 2 or 3 sessions per day.      Use CPM for 2 weeks or until you are told to stop.     Call MD / Call 911    Complete by:  As directed   If you experience chest pain or shortness of breath, CALL 911 and be transported to the hospital emergency room.  If you develope a fever above 101 F, pus (white drainage) or increased drainage or redness at the wound, or calf pain, call your surgeon's office.     Change dressing    Complete by:  As directed   Change dressing on  wednesday, then change the dressing daily with sterile 4 x 4 inch gauze dressing and apply TED hose.     Constipation Prevention    Complete by:  As directed   Drink plenty of fluids.  Prune juice may be helpful.  You may use a stool softener, such as Colace (over the counter) 100 mg twice a day.  Use MiraLax (over the counter) for constipation as needed.     Diet - low sodium heart healthy    Complete by:  As directed      Do not put a pillow under the knee. Place it under the heel.    Complete by:  As directed      Driving restrictions    Complete by:  As directed   No driving for 6 weeks     Increase activity slowly as tolerated    Complete by:  As directed      Lifting restrictions    Complete by:  As directed   No lifting for 6 weeks     TED hose    Complete by:  As directed   Use stockings (TED hose) for 2 weeks on both leg(s).  You may remove them at night for sleeping.           DISCHARGE MEDICATIONS:     Medication List    STOP taking these medications        Omega 3 1000 MG Caps      TAKE these medications        CALTRATE 600+D PO  Take 1 tablet by mouth 2 (two) times daily.     celecoxib 200 MG capsule  Commonly known as:  CELEBREX  Take 1 capsule (200 mg total) by mouth every 12 (twelve) hours.     cholecalciferol 1000 UNITS tablet  Commonly known as:  VITAMIN D  Take 1,000 Units by mouth daily.     diltiazem 30 MG tablet  Commonly known as:  CARDIZEM  Take 30 mg by mouth 2 (two) times daily.     enoxaparin 40 MG/0.4ML injection  Commonly known as:  LOVENOX  Inject 0.4 mLs (40 mg total) into the skin daily.     loratadine 10 MG tablet  Commonly known as:  CLARITIN  Take 10 mg by mouth daily as needed for allergies.     losartan-hydrochlorothiazide 50-12.5 MG tablet  Commonly known as:  HYZAAR  Take 1 tablet by mouth daily.     methocarbamol 500 MG tablet  Commonly known as:  ROBAXIN  Take 1-2 tablets (500-1,000 mg total) by mouth every 6  (six) hours as needed for muscle spasms.     metoCLOPramide 5 MG tablet  Commonly known as:  REGLAN  Take 1-2 tablets (5-10 mg total) by mouth every 8 (eight) hours as needed for nausea (if ondansetron (ZOFRAN) ineffective.).     mometasone 50 MCG/ACT nasal spray  Commonly known as:  NASONEX  Place 2 sprays into the nose daily as needed (seasonal allergies).     montelukast 10 MG tablet  Commonly known as:  SINGULAIR  Take 10 mg by mouth at bedtime as needed (seasonal allergies).     multivitamin tablet  Take 1 tablet by mouth daily.     omeprazole 20 MG capsule  Commonly known as:  PRILOSEC  Take 20 mg by mouth 2 (two) times daily before a meal.     oxyCODONE 5 MG immediate release tablet  Commonly known as:  Oxy IR/ROXICODONE  Take 1-2 tablets (5-10 mg total) by mouth every 3 (three) hours as needed for breakthrough pain.     oxyCODONE 10 mg 12 hr tablet  Commonly known as:  OXYCONTIN  Take 1 tablet (10 mg total) by mouth every 12 (twelve) hours.        FOLLOW UP VISIT:       Follow-up Information    Follow up with [facility].   Why:  They will contact you to schedule home therapy visits.   Contact information:   [facility]  [phone]       Follow up with Rudean Haskell, MD. Call on 08/04/2015.   Specialty:  Orthopedic Surgery   Contact information:   [address] [place] [address] [phone]       DISPOSITION: HOME   CONDITION:  Good   Marie Fuller 08/03/2015, 9:21 AM

## 2015-08-04 ENCOUNTER — Other Ambulatory Visit (HOSPITAL_COMMUNITY): Payer: Self-pay | Admitting: Orthopedic Surgery

## 2015-08-04 ENCOUNTER — Inpatient Hospital Stay (HOSPITAL_COMMUNITY): Admission: RE | Admit: 2015-08-04 | Payer: Medicare Other | Source: Ambulatory Visit

## 2015-08-04 DIAGNOSIS — R52 Pain, unspecified: Secondary | ICD-10-CM

## 2015-08-04 DIAGNOSIS — R609 Edema, unspecified: Secondary | ICD-10-CM

## 2015-08-05 ENCOUNTER — Ambulatory Visit (HOSPITAL_COMMUNITY)
Admission: RE | Admit: 2015-08-05 | Discharge: 2015-08-05 | Disposition: A | Discharge status: Home / Self Care | Payer: Medicare Other | Source: Ambulatory Visit | Attending: Orthopedic Surgery | Admitting: Orthopedic Surgery

## 2015-08-05 DIAGNOSIS — R609 Edema, unspecified: Secondary | ICD-10-CM | POA: Diagnosis present

## 2015-08-05 DIAGNOSIS — M79661 Pain in right lower leg: Secondary | ICD-10-CM | POA: Diagnosis not present

## 2015-08-05 DIAGNOSIS — R52 Pain, unspecified: Secondary | ICD-10-CM

## 2016-10-11 IMAGING — US US EXTREM LOW VENOUS*R*
1 series · 14 of 24 positions shown · non-contrast
Comparison: None

CLINICAL DATA: Swelling and pain x2 weeks, post knee replacement
surgery

EXAM:
RIGHT LOWER EXTREMITY VENOUS DOPPLER ULTRASOUND
TECHNIQUE: Gray-scale sonography with compression, as well as color and duplex
ultrasound, were performed to evaluate the deep venous system from
the level of the common femoral vein through the popliteal and
proximal calf veins.

[Series 1: us extrem low venous*right* · 0.08mm/px · 14 of 33 slices shown]
[im 1/33]
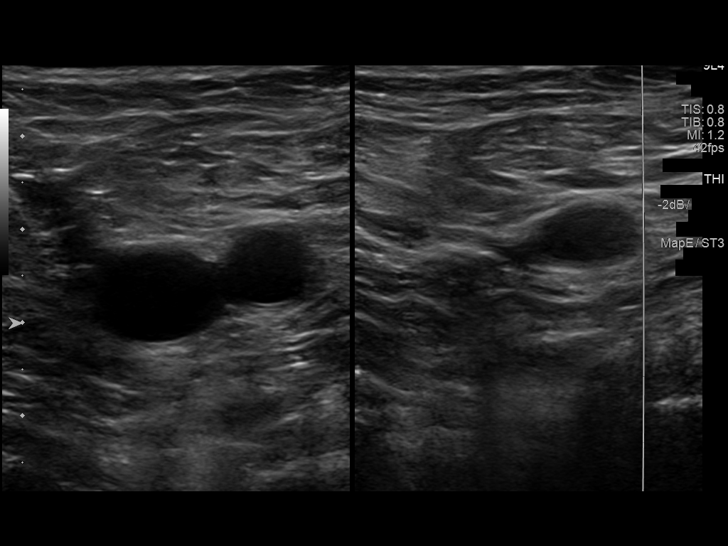
[im 3/33]
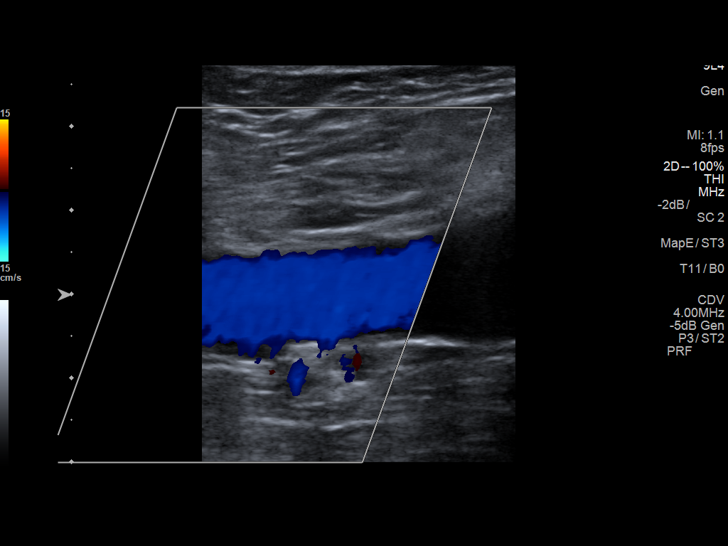
[im 6/33]
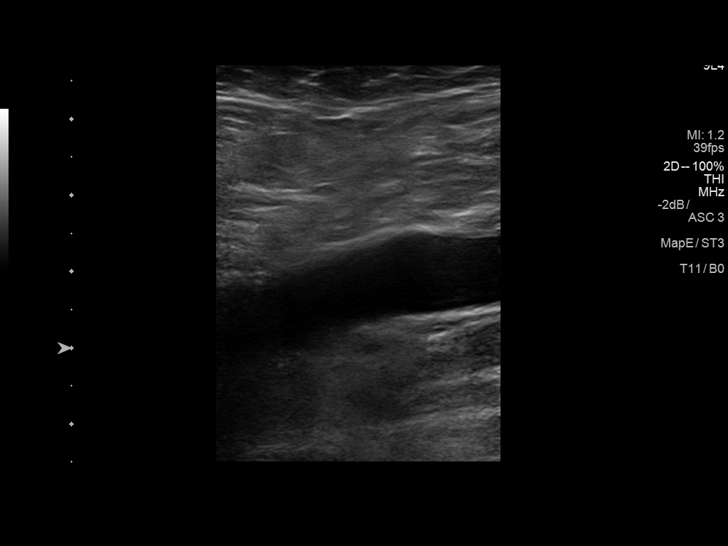
[im 9/33]
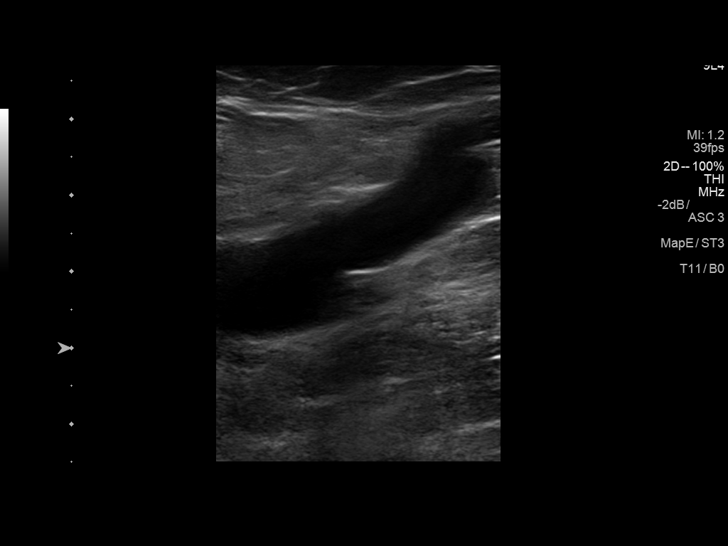
[im 10/33]
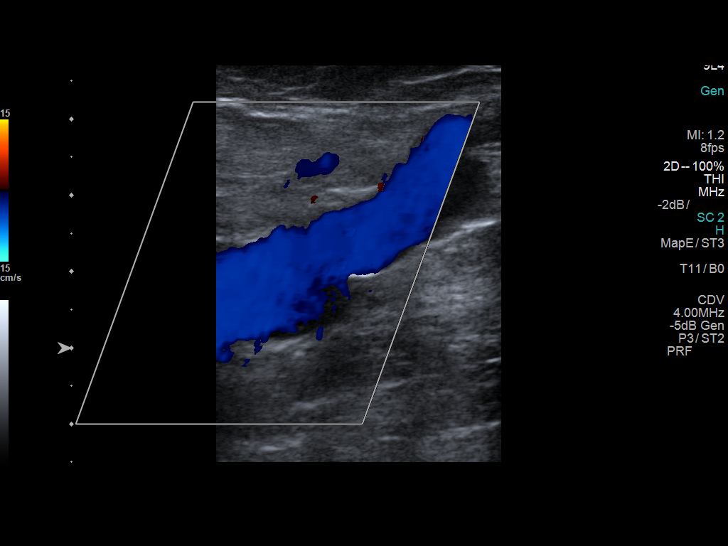
[im 13/33]
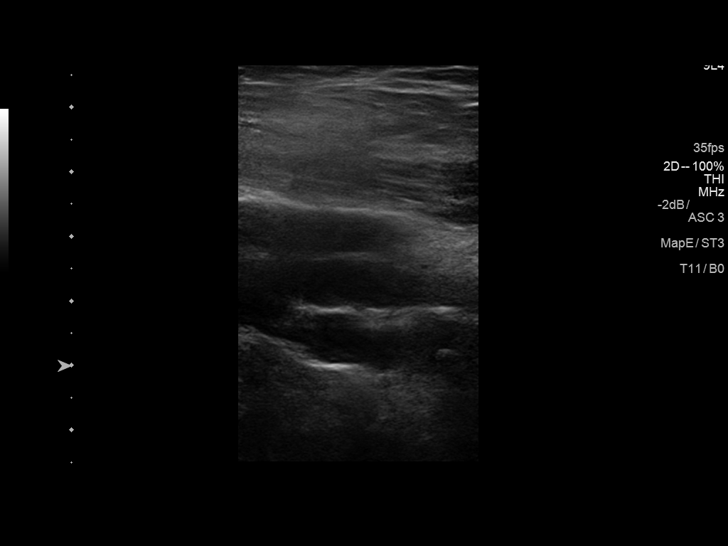
[im 16/33]
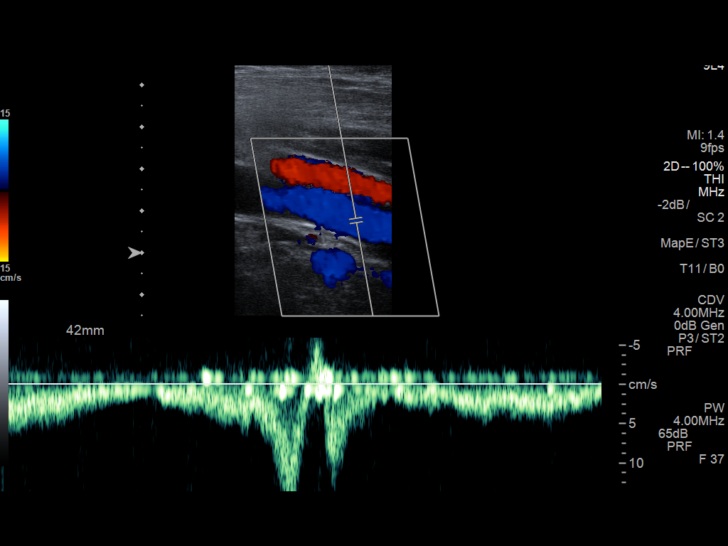
[im 17/33]
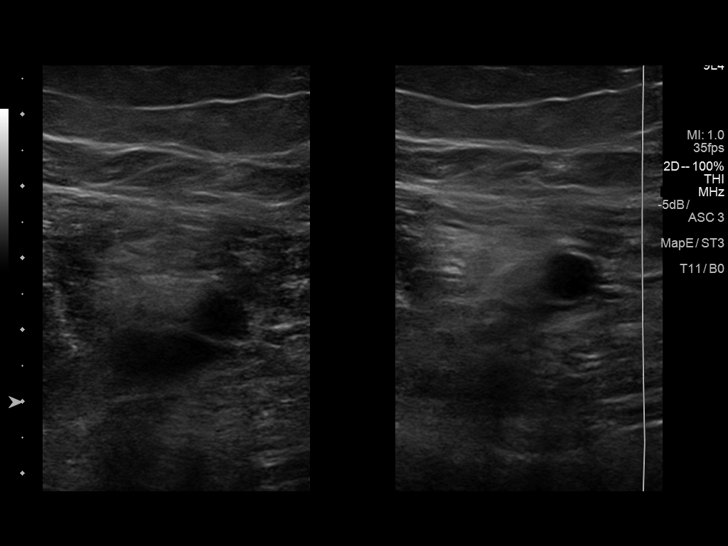
[im 20/33]
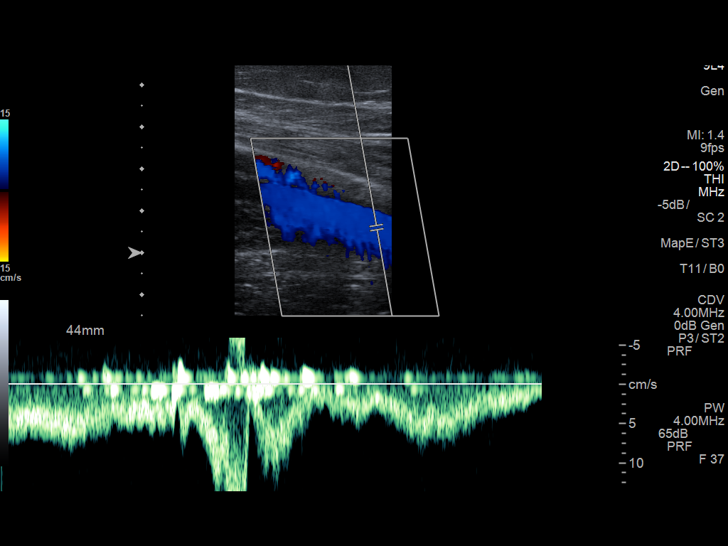
[im 23/33]
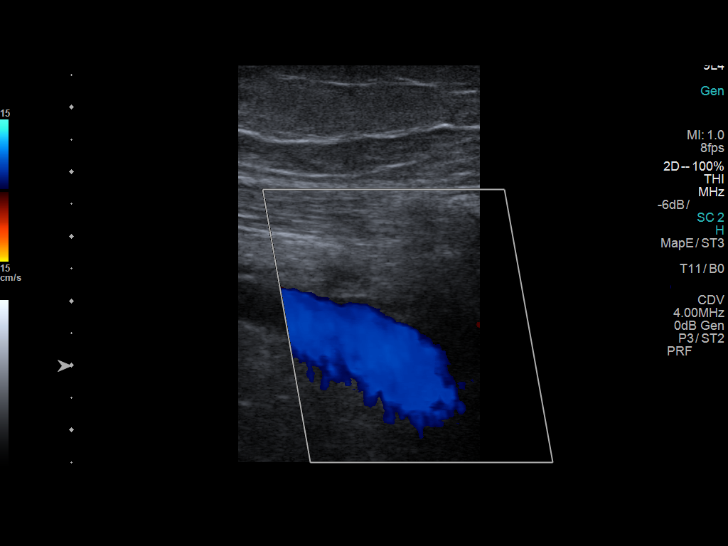
[im 26/33]
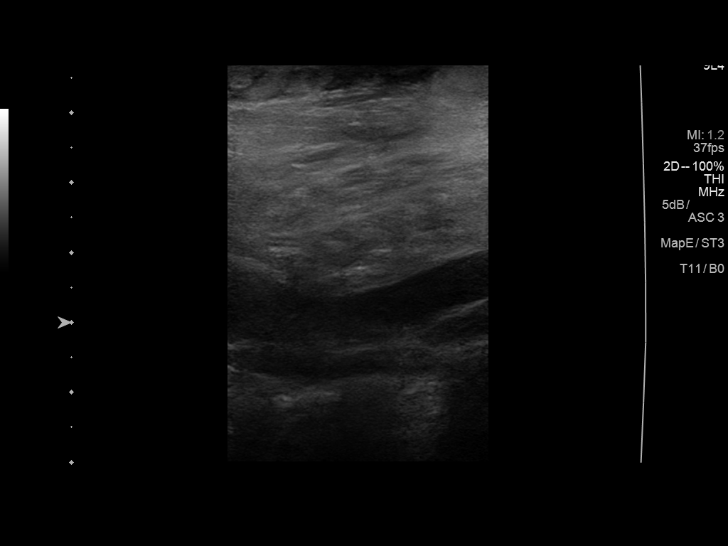
[im 27/33]
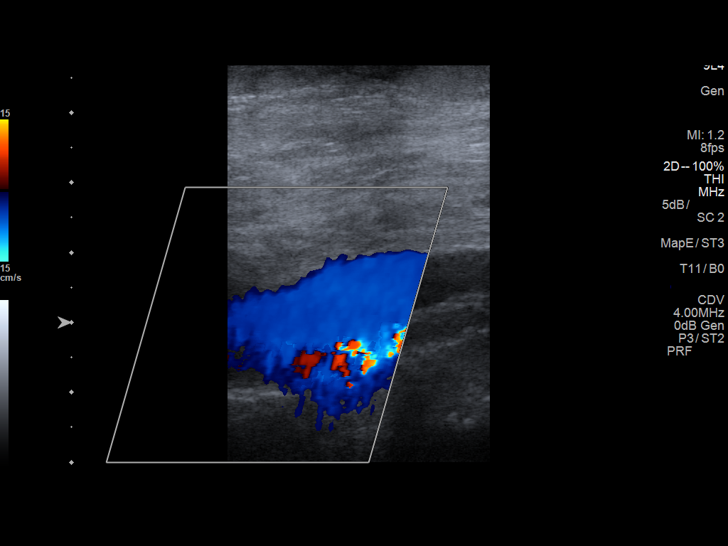
[im 30/33]
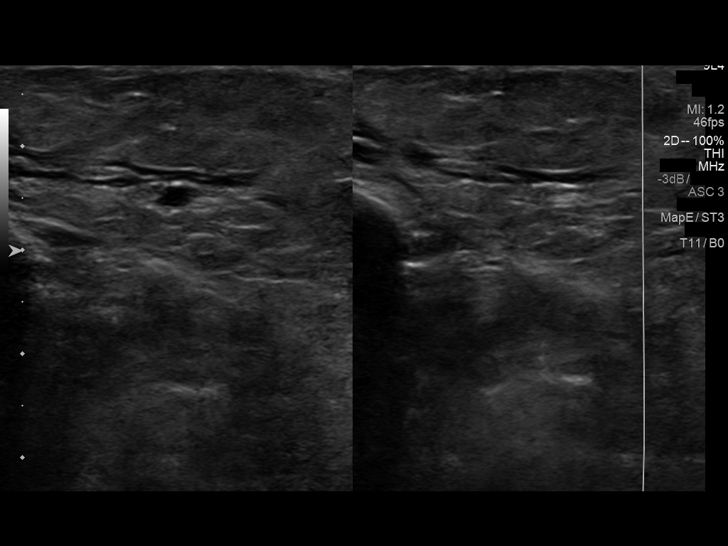
[im 33/33]
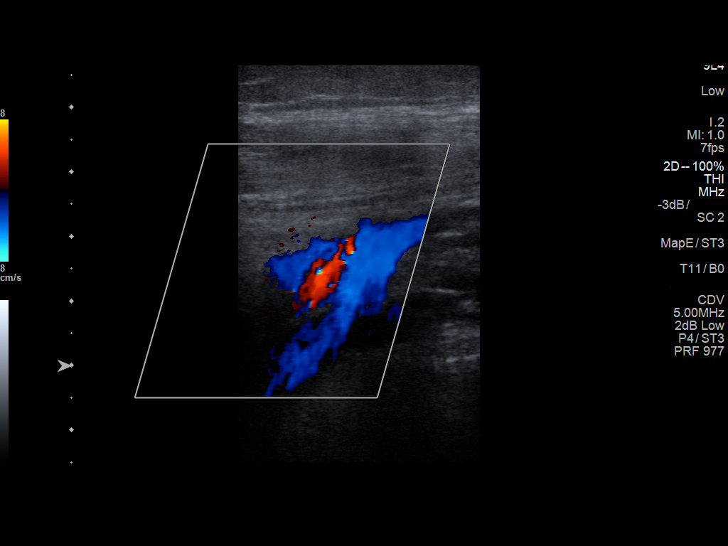

[14 of 24 positions shown; findings below may reference images not displayed]

FINDINGS: Normal compressibility of the common femoral, superficial femoral,
and popliteal veins, as well as the proximal calf veins. No filling
defects to suggest DVT on grayscale or color Doppler imaging.
Doppler waveforms show normal direction of venous flow, normal
respiratory phasicity and response to augmentation. Visualized
segments of the saphenous venous system normal in caliber and
compressibility. Survey views of the contralateral common femoral
vein are unremarkable.
IMPRESSION: 1. No evidence of lower extremity deep vein thrombosis, RIGHT .

## 2017-02-28 IMAGING — CT CT ABD-PELV W/ CM
2 of 8 series · 14 of 46 positions shown, 18 images · IV contrast (Omnipaque 300)
Comparison: None.

CLINICAL DATA: Acute onset epigastric

EXAM:
CT ABDOMEN AND PELVIS WITH CONTRAST
TECHNIQUE: Multidetector CT imaging of the abdomen and pelvis was performed
using the standard protocol following bolus administration of
intravenous contrast.
CONTRAST:  100mL OMNIPAQUE IOHEXOL 300 MG/ML  SOLN

[Series 2: abd_pel_with 5.0 b40s · axial · 0.73mm/px · z∈[-432,-72]mm · 11 of 84 slices shown, 15 images]
[im 6/84  soft-tissue]
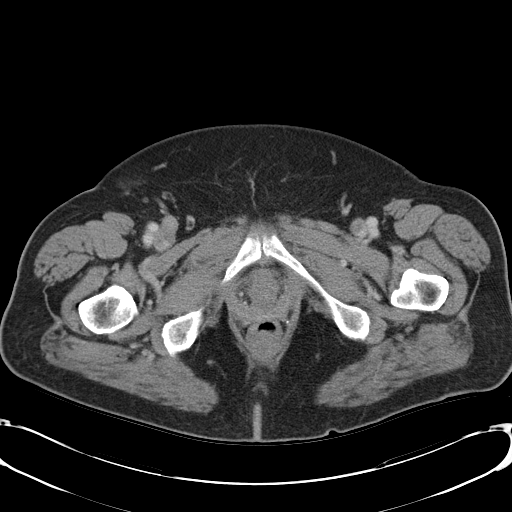
[im 6/84  bone]
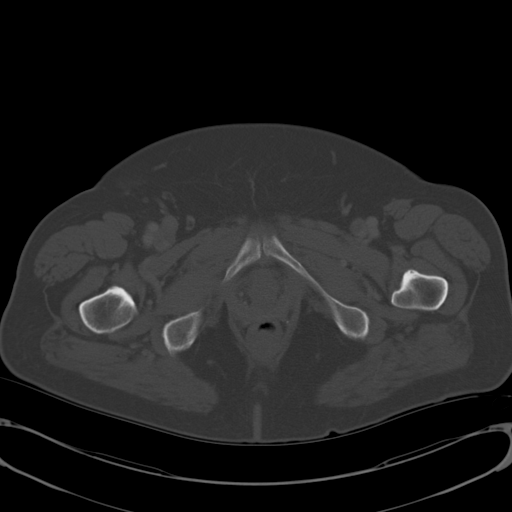
[im 16/84  soft-tissue]
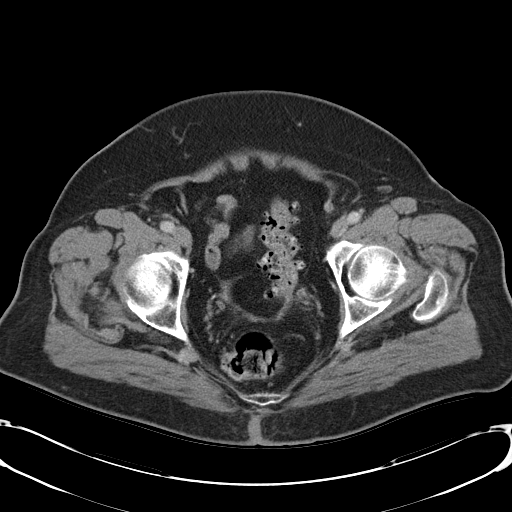
[im 26/84  soft-tissue]
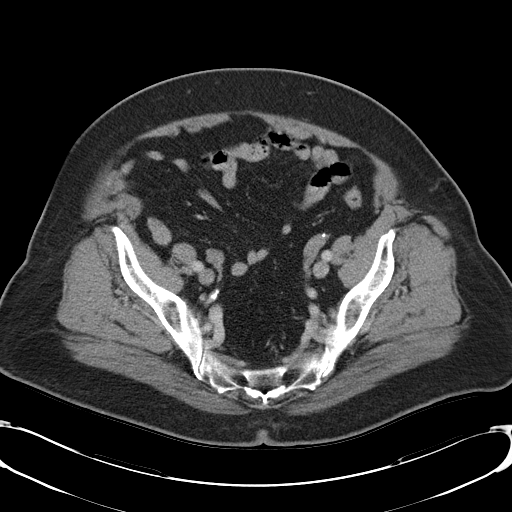
[im 32/84  soft-tissue]
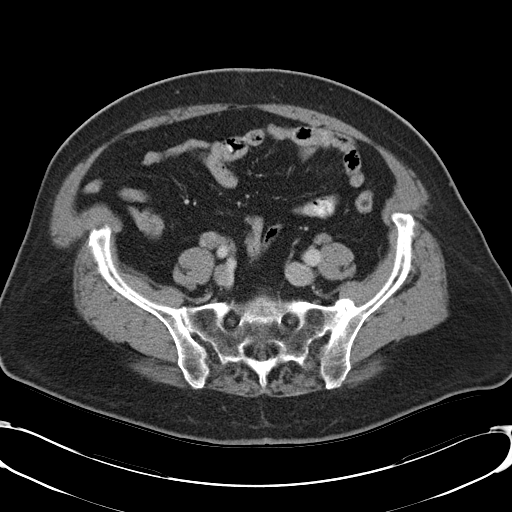
[im 42/84  soft-tissue]
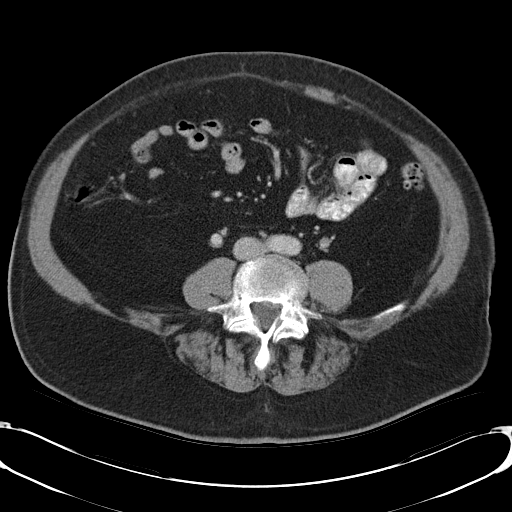
[im 52/84  soft-tissue]
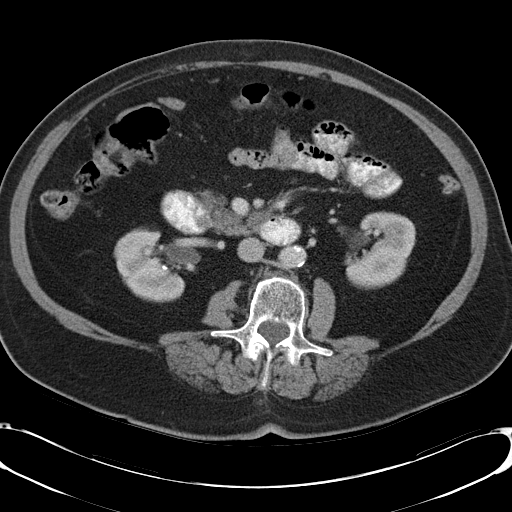
[im 58/84  soft-tissue]
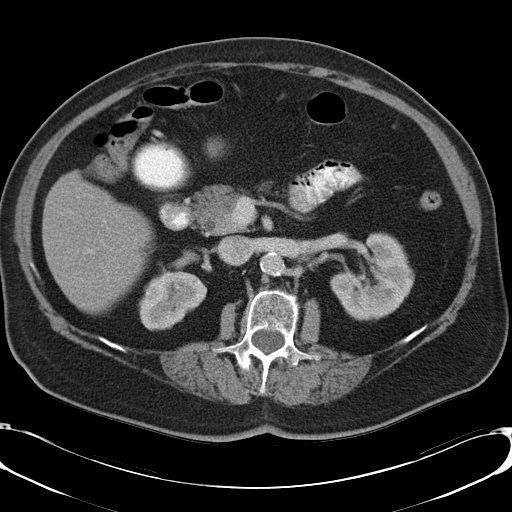
[im 63/84  lung]
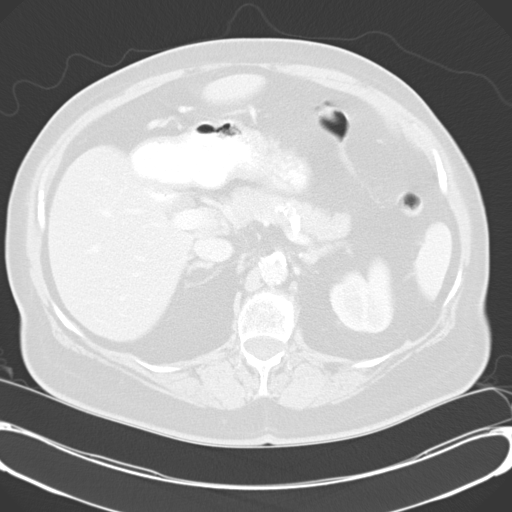
[im 68/84  soft-tissue]
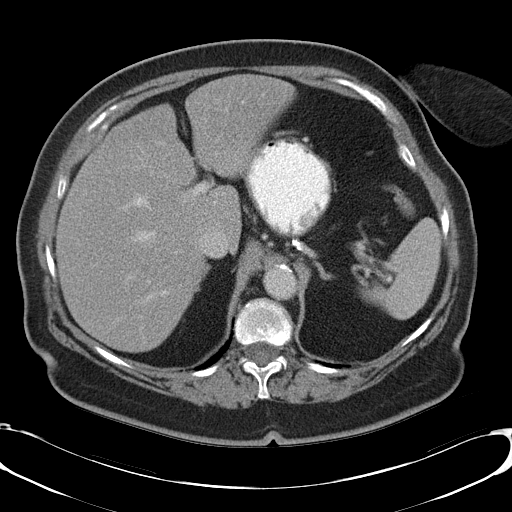
[im 68/84  lung]
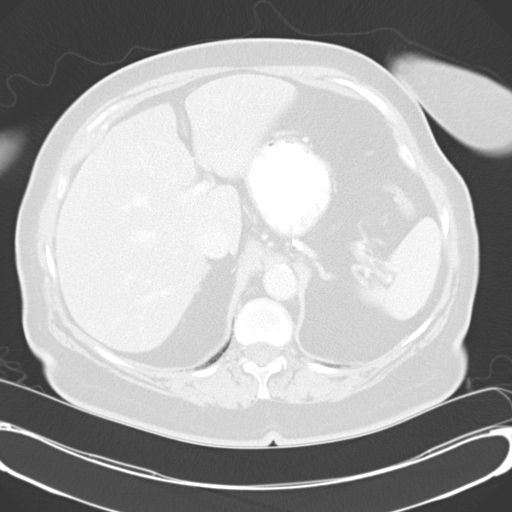
[im 73/84  lung]
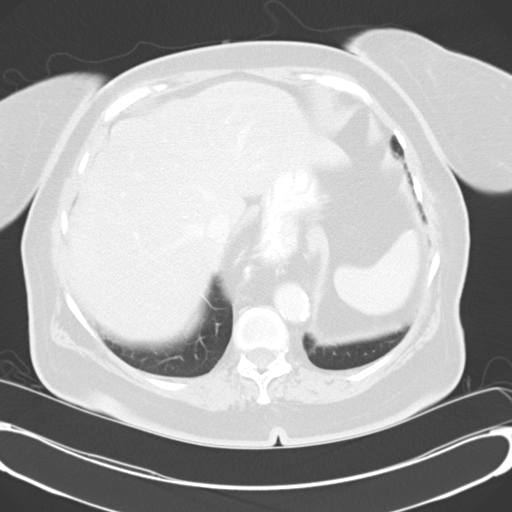
[im 78/84  soft-tissue]
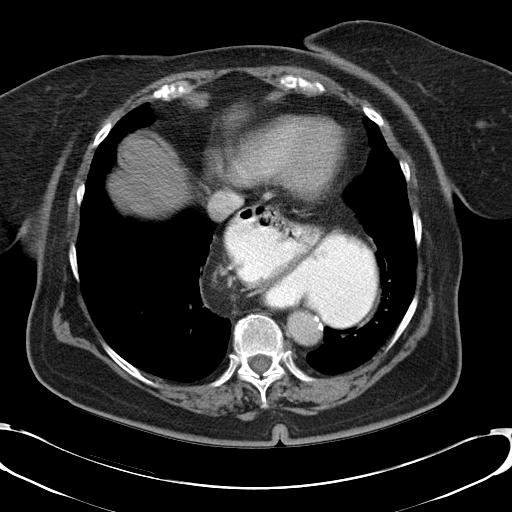
[im 78/84  lung]
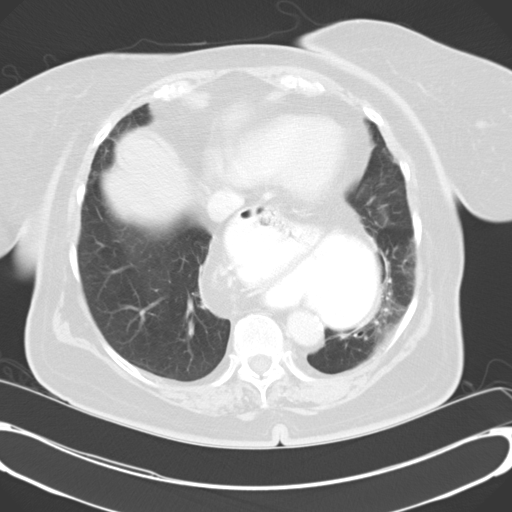
[im 78/84  bone]
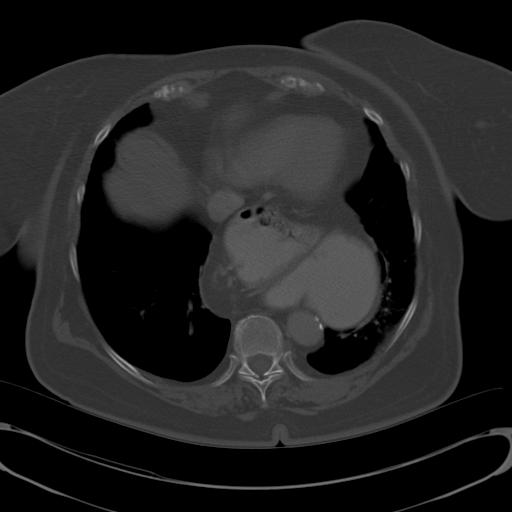

[Series 6: mpr cor post contrast (id) · coronal · 0.78mm/px · 3 of 93 slices shown]
[im 24/93  soft-tissue]
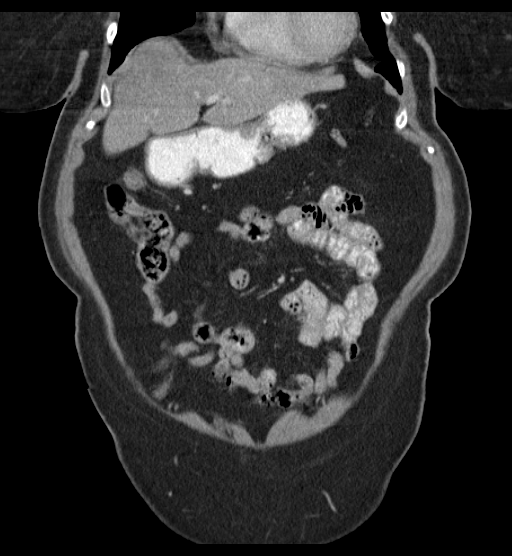
[im 47/93  soft-tissue]
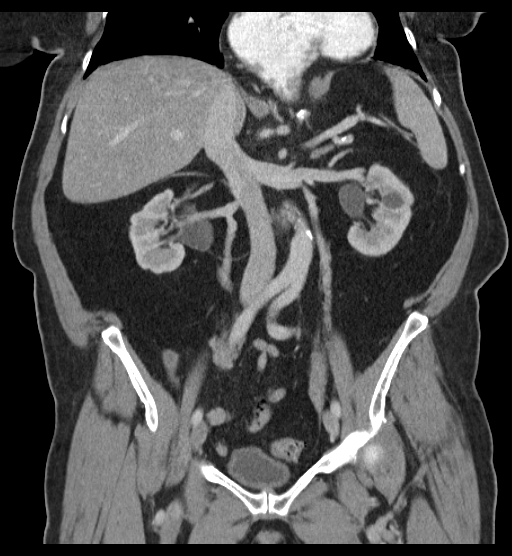
[im 70/93  soft-tissue]
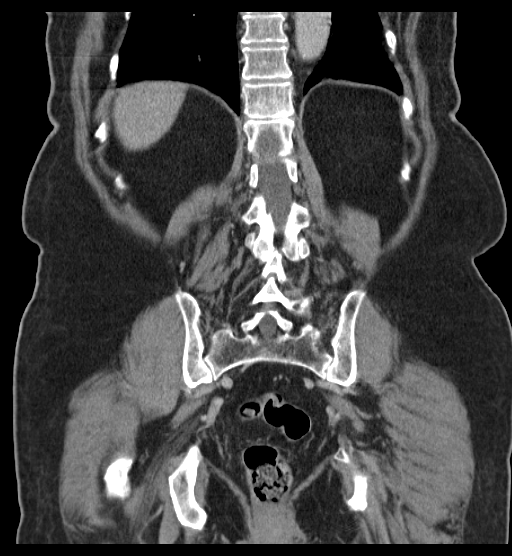

[14 of 46 positions shown; findings below may reference images not displayed]

FINDINGS: Mild left basilar atelectasis is noted. A large contrast filled
hiatal hernia is noted. Mild calcification is noted at the mitral
and aortic valves.

The liver and spleen are unremarkable in appearance. The gallbladder
is within normal limits. The pancreas and adrenal glands are
unremarkable.

Small bilateral renal cysts are seen, measuring up to 1.6 cm in
size. There is mild right renal scarring. Bilateral extrarenal
pelves remain within normal limits. A 4 mm nonobstructing stone is
noted at the interpole region of the right kidney. No perinephric
stranding is appreciated. No obstructing ureteral stones are seen.
There is no evidence of hydronephrosis.

No free fluid is identified. The small bowel is unremarkable in
appearance. The stomach is within normal limits. No acute vascular
abnormalities are seen. Mild scattered calcification is seen along
the abdominal aorta and its branches.

The patient is status post appendectomy. Scattered diverticulosis is
noted along the descending and proximal sigmoid colon, without
evidence of diverticulitis.

The bladder is mildly distended and grossly unremarkable. The
patient is status post hysterectomy. The ovaries are relatively
symmetric. No suspicious adnexal masses are seen. No inguinal
lymphadenopathy is seen.

No acute osseous abnormalities are identified. Vacuum phenomenon is
noted at multiple levels along the lumbar spine.
IMPRESSION: 1. No acute abnormality seen to explain the patient's symptoms.
2. Large contrast hiatal hernia seen. Associated mild left basilar
atelectasis noted.
3. Small bilateral renal cysts noted. Mild right renal scarring. 4
mm nonobstructing stone at the interpole region of the right kidney.
4. Mild scattered calcification along the abdominal aorta and its
branches.
5. Scattered diverticulosis along the descending and proximal
sigmoid colon, without evidence of diverticulitis.

## 2019-03-14 ENCOUNTER — Other Ambulatory Visit: Payer: Self-pay | Admitting: General Surgery

## 2019-06-17 ENCOUNTER — Encounter: Payer: Self-pay | Admitting: Internal Medicine

## 2019-07-01 ENCOUNTER — Ambulatory Visit (INDEPENDENT_AMBULATORY_CARE_PROVIDER_SITE_OTHER): Payer: Medicare Other | Admitting: Gastroenterology

## 2019-07-01 ENCOUNTER — Other Ambulatory Visit (HOSPITAL_COMMUNITY)
Admission: RE | Admit: 2019-07-01 | Discharge: 2019-07-01 | Disposition: A | Discharge status: Home / Self Care | Payer: Medicare Other | Source: Ambulatory Visit | Attending: Gastroenterology | Admitting: Gastroenterology

## 2019-07-01 ENCOUNTER — Other Ambulatory Visit: Payer: Self-pay

## 2019-07-01 ENCOUNTER — Encounter: Payer: Self-pay | Admitting: *Deleted

## 2019-07-01 ENCOUNTER — Encounter: Payer: Self-pay | Admitting: Gastroenterology

## 2019-07-01 VITALS — BP 133/84 | HR 83 | Temp 96.9°F | Ht 59.0 in | Wt 147.6 lb

## 2019-07-01 DIAGNOSIS — R1010 Upper abdominal pain, unspecified: Secondary | ICD-10-CM

## 2019-07-01 DIAGNOSIS — R109 Unspecified abdominal pain: Secondary | ICD-10-CM | POA: Insufficient documentation

## 2019-07-01 DIAGNOSIS — R1031 Right lower quadrant pain: Secondary | ICD-10-CM | POA: Insufficient documentation

## 2019-07-01 LAB — CBC WITH DIFFERENTIAL/PLATELET
Abs Immature Granulocytes: 0.01 10*3/uL (ref 0.00–0.07)
Basophils Absolute: 0 10*3/uL (ref 0.0–0.1)
Basophils Relative: 1 %
Eosinophils Absolute: 0.3 10*3/uL (ref 0.0–0.5)
Eosinophils Relative: 5 %
HCT: 37.9 % (ref 36.0–46.0)
Hemoglobin: 11.4 g/dL — ABNORMAL LOW (ref 12.0–15.0)
Immature Granulocytes: 0 %
Lymphocytes Relative: 39 %
Lymphs Abs: 2.5 10*3/uL (ref 0.7–4.0)
MCH: 26.5 pg (ref 26.0–34.0)
MCHC: 30.1 g/dL (ref 30.0–36.0)
MCV: 88.1 fL (ref 80.0–100.0)
Monocytes Absolute: 0.6 10*3/uL (ref 0.1–1.0)
Monocytes Relative: 9 %
Neutro Abs: 3 10*3/uL (ref 1.7–7.7)
Neutrophils Relative %: 46 %
Platelets: 254 10*3/uL (ref 150–400)
RBC: 4.3 MIL/uL (ref 3.87–5.11)
RDW: 15.8 % — ABNORMAL HIGH (ref 11.5–15.5)
WBC: 6.5 10*3/uL (ref 4.0–10.5)
nRBC: 0 % (ref 0.0–0.2)

## 2019-07-01 LAB — COMPREHENSIVE METABOLIC PANEL
ALT: 13 U/L (ref 0–44)
AST: 19 U/L (ref 15–41)
Albumin: 4.2 g/dL (ref 3.5–5.0)
Alkaline Phosphatase: 70 U/L (ref 38–126)
Anion gap: 11 (ref 5–15)
BUN: 12 mg/dL (ref 8–23)
CO2: 28 mmol/L (ref 22–32)
Calcium: 10.2 mg/dL (ref 8.9–10.3)
Chloride: 100 mmol/L (ref 98–111)
Creatinine, Ser: 0.72 mg/dL (ref 0.44–1.00)
GFR calc Af Amer: >60 mL/min (ref >60)
GFR calc non Af Amer: >60 mL/min (ref >60)
Glucose, Bld: 104 mg/dL — ABNORMAL HIGH (ref 70–99)
Potassium: 4.6 mmol/L (ref 3.5–5.1)
Sodium: 139 mmol/L (ref 135–145)
Total Bilirubin: 0.4 mg/dL (ref 0.3–1.2)
Total Protein: 8.1 g/dL (ref 6.5–8.1)

## 2019-07-01 NOTE — Progress Notes (Signed)
Primary Care Physician:  Normand Sloop, MD  Primary Gastroenterologist:  Garfield Cornea, MD   Chief Complaint  Patient presents with  . Abdominal Pain    started after taking Bactrim    HPI:  Marie Fuller is a 84 y.o. female here at the request of Dr. Alvira Monday for GERD, epig pain.   Patient states she took Bactrim back in 02/2019. She developed abdominal pain in the epigastric region that would radiate downward to the lower abdomen. Symptoms started only after taking Bactrim. Pain intermittent. Can last for 4-6 hours. Associated with nausea. Sometimes feels like a gnawing pain. Food can make it ease off. Glass of milk helps. She denies heartburn. Recently saw Dr. Earley Brooke for symptoms. She states the pain was severe at the time and he could not do her EGD for two weeks. She did not feel this was a reasonable time frame to wait given the extent of her pain. She went home and laid on ice packs (applied to her back) until the epigastric pain tapered. Patient felt like she should have been given antibiotics like in the past when treated for diverticulitis.   She also feels like her bowels do not empty well. Feels like she has a weakness in the RLQ that allows stools to back up. Sometimes uses a baby suppository (once per week) to help with BM. No blood in stool. Little stool every day.   States she has been off omeprazole for 3 months now. Was on it for years. States her PCP had her stop it because they did not feel it was necessary. Epigastric pain no worse off PPI.   She has had EGD before 2016. She has some records today indicating she had gastric ulcer, gastritis, 5cm hiatal hernia, s/p esophageal dilation. Patient states she has had her esophagus stretched a couple of times.  Prior colonoscopy 2016, with diverticulosis, internal hemorrhoids.    Current Outpatient Medications  Medication Sig Dispense Refill  . aspirin EC 81 MG tablet Take 81 mg by mouth daily.    . Calcium Carbonate-Vitamin D  (CALTRATE 600+D PO) Take 1 tablet by mouth daily.     . cholecalciferol (VITAMIN D) 1000 UNITS tablet Take 1,000 Units by mouth daily.    Marland Kitchen loratadine (CLARITIN) 10 MG tablet Take 10 mg by mouth daily.   6  . losartan-hydrochlorothiazide (HYZAAR) 50-12.5 MG tablet Take 1 tablet by mouth daily.    . metoprolol tartrate (LOPRESSOR) 25 MG tablet Take 12.5 mg by mouth daily.    . Multiple Vitamin (MULTIVITAMIN) tablet Take 1 tablet by mouth daily.    . Omega-3 Fatty Acids (FISH OIL) 1000 MG CAPS Take by mouth daily.    . simvastatin (ZOCOR) 40 MG tablet Take 40 mg by mouth daily.     No current facility-administered medications for this visit.     Allergies as of 07/01/2019 - Review Complete 07/01/2019  Allergen Reaction Noted  . Bactrim [sulfamethoxazole-trimethoprim]  07/01/2019    Past Medical History:  Diagnosis Date  . Arthritis   . Asthma    bronchial.Singulair nightly as needed along with Claritin and Nasonex in the am  . Cataracts, bilateral    immature   . Diverticulosis   . GERD (gastroesophageal reflux disease)    takes Omeprazole daily  . Hard of hearing    doesn't wear hearing aides  . History of gastric ulcer   . History of kidney stones   . Hyperlipidemia    taken off meds by  medical md 4 months ago  . Hypertension    takes Diltiazem and Hyzaar daily  . Joint pain   . Joint swelling   . On home oxygen therapy    2l/m at night  . Rheumatoid arthritis ([place])   . Seasonal allergies     Past Surgical History:  Procedure Laterality Date  . ABDOMINAL HYSTERECTOMY    . APPENDECTOMY    . BIOPSY THYROID    . cardiac catherization  2002/2016  . CHOLECYSTECTOMY    . COLONOSCOPY    . COLONOSCOPY WITH ESOPHAGOGASTRODUODENOSCOPY (EGD) AND ESOPHAGEAL DILATION (ED)    . fingers on right hand removed    . left breast biopsy    . LITHOTRIPSY    . skin cancer removal    . TONSILLECTOMY    . TOTAL KNEE ARTHROPLASTY Right 07/20/2015   Procedure: TOTAL KNEE ARTHROPLASTY;   Surgeon: Vickey Huger, MD;  Location: [facility];  Service: Orthopedics;  Laterality: Right;    Family History  Problem Relation Age of Onset  . Cancer Mother        kidney cancer  . Asthma Mother   . Kidney cancer Sister   . Bladder Cancer Brother   . Uterine cancer Sister   . Lung cancer Sister 77  . Colon cancer Neg Hx     Social History   Socioeconomic History  . Marital status: Married    Spouse name: Not on file  . Number of children: Not on file  . Years of education: Not on file  . Highest education level: Not on file  Occupational History  . Not on file  Social Needs  . Financial resource strain: Not on file  . Food insecurity    Worry: Not on file    Inability: Not on file  . Transportation needs    Medical: Not on file    Non-medical: Not on file  Tobacco Use  . Smoking status: Never Smoker  . Smokeless tobacco: Never Used  Substance and Sexual Activity  . Alcohol use: No  . Drug use: No  . Sexual activity: Not on file  Lifestyle  . Physical activity    Days per week: Not on file    Minutes per session: Not on file  . Stress: Not on file  Relationships  . Social Herbalist on phone: Not on file    Gets together: Not on file    Attends religious service: Not on file    Active member of club or organization: Not on file    Attends meetings of clubs or organizations: Not on file    Relationship status: Not on file  . Intimate partner violence    Fear of current or ex partner: Not on file    Emotionally abused: Not on file    Physically abused: Not on file    Forced sexual activity: Not on file  Other Topics Concern  . Not on file  Social History Narrative  . Not on file      ROS:  General: Negative for anorexia, weight loss, fever, chills, fatigue, weakness. Eyes: Negative for vision changes.  ENT: Negative for hoarseness, difficulty swallowing , nasal congestion. CV: Negative for chest pain, angina, palpitations, dyspnea on exertion,  peripheral edema.  Respiratory: Negative for dyspnea at rest, dyspnea on exertion, cough, sputum, wheezing.  GI: See history of present illness. GU:  Negative for dysuria, hematuria, urinary incontinence, urinary frequency, nocturnal urination.  MS: Negative for joint  pain, low back pain.  Derm: Negative for rash or itching.  Neuro: Negative for weakness, abnormal sensation, seizure, frequent headaches, memory loss, confusion.  Psych: Negative for anxiety, depression, suicidal ideation, hallucinations.  Endo: Negative for unusual weight change.  Heme: Negative for bruising or bleeding. Allergy: Negative for rash or hives.    Physical Examination:  BP 133/84   Pulse 83   Temp (!) 96.9 F (36.1 C) (Temporal)   Ht 4\' 11"  (1.499 m)   Wt 147 lb 9.6 oz (67 kg)   BMI 29.81 kg/m    General: Well-nourished, well-developed in no acute distress.  Head: Normocephalic, atraumatic.   Eyes: Conjunctiva pink, no icterus. Mouth: Oropharyngeal mucosa moist and pink , no lesions erythema or exudate. Neck: Supple without thyromegaly, masses, or lymphadenopathy.  Lungs: Clear to auscultation bilaterally.  Heart: Regular rate and rhythm, no murmurs rubs or gallops.  Abdomen: Bowel sounds are normal, epig and rlq tenderness, she has some fullness in rlq without discrete mass, no hepatosplenomegaly or masses, no abdominal bruits or    hernia , no rebound or guarding.   Rectal: not performed Extremities: No lower extremity edema. No clubbing or deformities.  Neuro: Alert and oriented x 4 , grossly normal neurologically.  Skin: Warm and dry, no rash or jaundice.   Psych: Alert and cooperative, normal mood and affect.  Labs: Labs dated 10/11/2018: White blood cell count 5400, hemoglobin 12, hematocrit 38.2, platelets 222,000, sodium 142, potassium 5, glucose 102, BUN 17, creatinine 0.7, albumin 4.4, total bilirubin 0.5, alkaline phosphatase 92, AST 28, ALT 21  Imaging Studies: No results found.

## 2019-07-01 NOTE — H&P (View-Only) (Signed)
Primary Care Physician:  Normand Sloop, MD  Primary Gastroenterologist:  Garfield Cornea, MD   Chief Complaint  Patient presents with  . Abdominal Pain    started after taking Bactrim    HPI:  Marie Fuller is a 84 y.o. female here at the request of Dr. Alvira Monday for GERD, epig pain.   Patient states she took Bactrim back in 02/2019. She developed abdominal pain in the epigastric region that would radiate downward to the lower abdomen. Symptoms started only after taking Bactrim. Pain intermittent. Can last for 4-6 hours. Associated with nausea. Sometimes feels like a gnawing pain. Food can make it ease off. Glass of milk helps. She denies heartburn. Recently saw Dr. Earley Brooke for symptoms. She states the pain was severe at the time and he could not do her EGD for two weeks. She did not feel this was a reasonable time frame to wait given the extent of her pain. She went home and laid on ice packs (applied to her back) until the epigastric pain tapered. Patient felt like she should have been given antibiotics like in the past when treated for diverticulitis.   She also feels like her bowels do not empty well. Feels like she has a weakness in the RLQ that allows stools to back up. Sometimes uses a baby suppository (once per week) to help with BM. No blood in stool. Little stool every day.   States she has been off omeprazole for 3 months now. Was on it for years. States her PCP had her stop it because they did not feel it was necessary. Epigastric pain no worse off PPI.   She has had EGD before 2016. She has some records today indicating she had gastric ulcer, gastritis, 5cm hiatal hernia, s/p esophageal dilation. Patient states she has had her esophagus stretched a couple of times.  Prior colonoscopy 2016, with diverticulosis, internal hemorrhoids.    Current Outpatient Medications  Medication Sig Dispense Refill  . aspirin EC 81 MG tablet Take 81 mg by mouth daily.    . Calcium Carbonate-Vitamin D  (CALTRATE 600+D PO) Take 1 tablet by mouth daily.     . cholecalciferol (VITAMIN D) 1000 UNITS tablet Take 1,000 Units by mouth daily.    Marland Kitchen loratadine (CLARITIN) 10 MG tablet Take 10 mg by mouth daily.   6  . losartan-hydrochlorothiazide (HYZAAR) 50-12.5 MG tablet Take 1 tablet by mouth daily.    . metoprolol tartrate (LOPRESSOR) 25 MG tablet Take 12.5 mg by mouth daily.    . Multiple Vitamin (MULTIVITAMIN) tablet Take 1 tablet by mouth daily.    . Omega-3 Fatty Acids (FISH OIL) 1000 MG CAPS Take by mouth daily.    . simvastatin (ZOCOR) 40 MG tablet Take 40 mg by mouth daily.     No current facility-administered medications for this visit.     Allergies as of 07/01/2019 - Review Complete 07/01/2019  Allergen Reaction Noted  . Bactrim [sulfamethoxazole-trimethoprim]  07/01/2019    Past Medical History:  Diagnosis Date  . Arthritis   . Asthma    bronchial.Singulair nightly as needed along with Claritin and Nasonex in the am  . Cataracts, bilateral    immature   . Diverticulosis   . GERD (gastroesophageal reflux disease)    takes Omeprazole daily  . Hard of hearing    doesn't wear hearing aides  . History of gastric ulcer   . History of kidney stones   . Hyperlipidemia    taken off meds by  medical md 4 months ago  . Hypertension    takes Diltiazem and Hyzaar daily  . Joint pain   . Joint swelling   . On home oxygen therapy    2l/m at night  . Rheumatoid arthritis ([place])   . Seasonal allergies     Past Surgical History:  Procedure Laterality Date  . ABDOMINAL HYSTERECTOMY    . APPENDECTOMY    . BIOPSY THYROID    . cardiac catherization  2002/2016  . CHOLECYSTECTOMY    . COLONOSCOPY    . COLONOSCOPY WITH ESOPHAGOGASTRODUODENOSCOPY (EGD) AND ESOPHAGEAL DILATION (ED)    . fingers on right hand removed    . left breast biopsy    . LITHOTRIPSY    . skin cancer removal    . TONSILLECTOMY    . TOTAL KNEE ARTHROPLASTY Right 07/20/2015   Procedure: TOTAL KNEE ARTHROPLASTY;   Surgeon: Vickey Huger, MD;  Location: [facility];  Service: Orthopedics;  Laterality: Right;    Family History  Problem Relation Age of Onset  . Cancer Mother        kidney cancer  . Asthma Mother   . Kidney cancer Sister   . Bladder Cancer Brother   . Uterine cancer Sister   . Lung cancer Sister 72  . Colon cancer Neg Hx     Social History   Socioeconomic History  . Marital status: Married    Spouse name: Not on file  . Number of children: Not on file  . Years of education: Not on file  . Highest education level: Not on file  Occupational History  . Not on file  Social Needs  . Financial resource strain: Not on file  . Food insecurity    Worry: Not on file    Inability: Not on file  . Transportation needs    Medical: Not on file    Non-medical: Not on file  Tobacco Use  . Smoking status: Never Smoker  . Smokeless tobacco: Never Used  Substance and Sexual Activity  . Alcohol use: No  . Drug use: No  . Sexual activity: Not on file  Lifestyle  . Physical activity    Days per week: Not on file    Minutes per session: Not on file  . Stress: Not on file  Relationships  . Social Herbalist on phone: Not on file    Gets together: Not on file    Attends religious service: Not on file    Active member of club or organization: Not on file    Attends meetings of clubs or organizations: Not on file    Relationship status: Not on file  . Intimate partner violence    Fear of current or ex partner: Not on file    Emotionally abused: Not on file    Physically abused: Not on file    Forced sexual activity: Not on file  Other Topics Concern  . Not on file  Social History Narrative  . Not on file      ROS:  General: Negative for anorexia, weight loss, fever, chills, fatigue, weakness. Eyes: Negative for vision changes.  ENT: Negative for hoarseness, difficulty swallowing , nasal congestion. CV: Negative for chest pain, angina, palpitations, dyspnea on exertion,  peripheral edema.  Respiratory: Negative for dyspnea at rest, dyspnea on exertion, cough, sputum, wheezing.  GI: See history of present illness. GU:  Negative for dysuria, hematuria, urinary incontinence, urinary frequency, nocturnal urination.  MS: Negative for joint  pain, low back pain.  Derm: Negative for rash or itching.  Neuro: Negative for weakness, abnormal sensation, seizure, frequent headaches, memory loss, confusion.  Psych: Negative for anxiety, depression, suicidal ideation, hallucinations.  Endo: Negative for unusual weight change.  Heme: Negative for bruising or bleeding. Allergy: Negative for rash or hives.    Physical Examination:  BP 133/84   Pulse 83   Temp (!) 96.9 F (36.1 C) (Temporal)   Ht 4\' 11"  (1.499 m)   Wt 147 lb 9.6 oz (67 kg)   BMI 29.81 kg/m    General: Well-nourished, well-developed in no acute distress.  Head: Normocephalic, atraumatic.   Eyes: Conjunctiva pink, no icterus. Mouth: Oropharyngeal mucosa moist and pink , no lesions erythema or exudate. Neck: Supple without thyromegaly, masses, or lymphadenopathy.  Lungs: Clear to auscultation bilaterally.  Heart: Regular rate and rhythm, no murmurs rubs or gallops.  Abdomen: Bowel sounds are normal, epig and rlq tenderness, she has some fullness in rlq without discrete mass, no hepatosplenomegaly or masses, no abdominal bruits or    hernia , no rebound or guarding.   Rectal: not performed Extremities: No lower extremity edema. No clubbing or deformities.  Neuro: Alert and oriented x 4 , grossly normal neurologically.  Skin: Warm and dry, no rash or jaundice.   Psych: Alert and cooperative, normal mood and affect.  Labs: Labs dated 10/11/2018: White blood cell count 5400, hemoglobin 12, hematocrit 38.2, platelets 222,000, sodium 142, potassium 5, glucose 102, BUN 17, creatinine 0.7, albumin 4.4, total bilirubin 0.5, alkaline phosphatase 92, AST 28, ALT 21  Imaging Studies: No results found.

## 2019-07-01 NOTE — Patient Instructions (Signed)
1. When you go get the contrast for your CT scan, have your labs drawn at [facility].  2. CT of abdomen to evaluate your pain. We will be in touch with results as available. This can take up to 5 business days, but urgent results will be communicated within 24 hours.

## 2019-07-02 NOTE — Assessment & Plan Note (Signed)
84 y/o female presenting with complaints of acute onset epigastric pain radiating downward to lower abdomen which began after taking Bactrim back in 02/2019. Symptoms were more pronounced in early 04-26-19. Described severe pain at that time which was debilitating. She has persistent pain now but lessened in severity. Sometimes better with food/milk, some difficulty emptying stool and sensation of fullness in the RLQ when this occurs. On exam she has epig tenderness/rlq tenderness with rlq fullness but no mass.   Patient's abdominal pain may be multifactorial. She describes some ulcer type symptoms but PUD would not explain her lower abdominal pain issues. Lower abdominal pain may be due to incomplete stools/relative constipation. Her last EGD/colonoscopy were in 2016.   At this time will evaluate abdominal pain with CT A/P with contrast and labs. Based on findings, she may require EGD for further evaluation of her abd pain. She wants to hold off on starting back on PPI for now pending CT results.

## 2019-07-04 NOTE — Progress Notes (Signed)
CC'ED TO PCP 

## 2019-07-05 ENCOUNTER — Ambulatory Visit (HOSPITAL_COMMUNITY)
Admission: RE | Admit: 2019-07-05 | Discharge: 2019-07-05 | Disposition: A | Discharge status: Home / Self Care | Payer: Medicare Other | Source: Ambulatory Visit | Attending: Gastroenterology | Admitting: Gastroenterology

## 2019-07-05 ENCOUNTER — Encounter (HOSPITAL_COMMUNITY): Payer: Self-pay

## 2019-07-05 ENCOUNTER — Other Ambulatory Visit: Payer: Self-pay

## 2019-07-05 DIAGNOSIS — R1010 Upper abdominal pain, unspecified: Secondary | ICD-10-CM | POA: Insufficient documentation

## 2019-07-05 HISTORY — DX: Systemic involvement of connective tissue, unspecified: M35.9

## 2019-07-05 MED ORDER — IOHEXOL 300 MG/ML  SOLN
100.0000 mL | Freq: Once | INTRAMUSCULAR | Status: AC | PRN
  Administered 2019-07-05: 100 mL via INTRAVENOUS

## 2019-07-09 ENCOUNTER — Other Ambulatory Visit: Payer: Self-pay

## 2019-07-09 MED ORDER — PANTOPRAZOLE SODIUM 40 MG PO TBEC: 40.0000 mg | DELAYED_RELEASE_TABLET | Freq: Every day | ORAL | 3 refills | Status: DC

## 2019-07-09 MED ORDER — LINACLOTIDE 145 MCG PO CAPS: 145.0000 ug | ORAL_CAPSULE | Freq: Every day | ORAL | 3 refills | Status: DC

## 2019-07-10 ENCOUNTER — Other Ambulatory Visit: Payer: Self-pay

## 2019-07-10 DIAGNOSIS — R933 Abnormal findings on diagnostic imaging of other parts of digestive tract: Secondary | ICD-10-CM

## 2019-07-10 DIAGNOSIS — R1013 Epigastric pain: Secondary | ICD-10-CM

## 2019-07-10 DIAGNOSIS — K449 Diaphragmatic hernia without obstruction or gangrene: Secondary | ICD-10-CM

## 2019-07-10 MED ORDER — PEG 3350-KCL-NA BICARB-NACL 420 G PO SOLR: 4000.0000 mL | ORAL | 0 refills | Status: DC

## 2019-07-15 ENCOUNTER — Telehealth: Payer: Self-pay | Admitting: Internal Medicine

## 2019-07-15 NOTE — Telephone Encounter (Signed)
Pt has questions about her medications. [phone]

## 2019-07-15 NOTE — Telephone Encounter (Signed)
Spoke with pt. Pt states that her medication for Jerrye Bushy was covered by her insurance and the Linzess was over $400.00 and not covered. Pt would like to continue to take her otc antiacid that is the same as Mylanta. Pt says it helps her bowels move. Pt is aware that we can check into her insurance plan to see what medications are covered. Pt says she would like to know if she can take the antiacid first. Please advise.

## 2019-07-16 NOTE — Telephone Encounter (Signed)
tried calling pt. Number is currently busy. Will call pt back.

## 2019-07-16 NOTE — Telephone Encounter (Signed)
She can continue Mylanta equivalent for now IF it contains magnesium hydroxide (which is likely the part helping with her bowels) AND she is having regular soft stools.   IF above not true, then would advise giving her Linzess samples (to take the week before her upcoming TCS to make sure she is adequately cleaned out) OR take Miralax one capful once to twice daily to maintain soft regular stool.

## 2019-07-17 NOTE — Telephone Encounter (Signed)
Called pt back. Pts Mylanta does have magnesium hydroxide in it and pts stool is soft. Pt will left [facility] know if her bowels change. Pt is aware that [facility] wants to make sure that she is cleaned out prior to her having her TCS.

## 2019-07-21 ENCOUNTER — Other Ambulatory Visit: Payer: Self-pay | Admitting: Internal Medicine

## 2019-07-22 ENCOUNTER — Telehealth: Payer: Self-pay | Admitting: Internal Medicine

## 2019-07-22 NOTE — Telephone Encounter (Signed)
Pt has questions about her medication. She said the pharmacy was as confused as she was. She has a procedure scheduled with [facility] on 07/31/2019, but she was talking about her Linzess. Please call her back at [phone]

## 2019-07-22 NOTE — Telephone Encounter (Signed)
Called pt, she will come by office tomorrow to pick up Linzess samples. Miralax instructions placed up front with the samples.

## 2019-07-22 NOTE — Telephone Encounter (Signed)
Spoke with pt. Samples of Linzess are ready for pickup. Linzess RX was too expensive for pt to purchase. Pt is aware that she will pickup the 1 enema oct and 1 box of Doculax as directed. Pt's prep isn't available for pickup. It's on backorder per the pharmacy. They have Suprep available.

## 2019-07-29 ENCOUNTER — Other Ambulatory Visit: Payer: Self-pay

## 2019-07-29 ENCOUNTER — Other Ambulatory Visit (HOSPITAL_COMMUNITY)
Admission: RE | Admit: 2019-07-29 | Discharge: 2019-07-29 | Disposition: A | Discharge status: Home / Self Care | Payer: Medicare Other | Source: Ambulatory Visit | Attending: Internal Medicine | Admitting: Internal Medicine

## 2019-07-29 DIAGNOSIS — Z01812 Encounter for preprocedural laboratory examination: Secondary | ICD-10-CM | POA: Insufficient documentation

## 2019-07-29 DIAGNOSIS — Z20828 Contact with and (suspected) exposure to other viral communicable diseases: Secondary | ICD-10-CM | POA: Diagnosis not present

## 2019-07-29 LAB — SARS CORONAVIRUS 2 (TAT 6-24 HRS): SARS Coronavirus 2: NEGATIVE

## 2019-07-30 ENCOUNTER — Telehealth: Payer: Self-pay | Admitting: *Deleted

## 2019-07-30 NOTE — Telephone Encounter (Signed)
Called patient to see if she could move procedure time up for tomorrow. She declined stating she did not know how the weather was going to be and she is coming from [place]. Endo is aware

## 2019-07-31 ENCOUNTER — Encounter (HOSPITAL_COMMUNITY): Payer: Self-pay | Admitting: Internal Medicine

## 2019-07-31 ENCOUNTER — Ambulatory Visit (HOSPITAL_COMMUNITY)
Admission: RE | Admit: 2019-07-31 | Discharge: 2019-07-31 | Disposition: A | Discharge status: Home / Self Care | Payer: Medicare Other | Attending: Internal Medicine | Admitting: Internal Medicine

## 2019-07-31 ENCOUNTER — Other Ambulatory Visit: Payer: Self-pay

## 2019-07-31 ENCOUNTER — Encounter (HOSPITAL_COMMUNITY)
Admission: RE | Disposition: A | Discharge status: Home / Self Care | Payer: Self-pay | Source: Home / Self Care | Attending: Internal Medicine

## 2019-07-31 DIAGNOSIS — Z96651 Presence of right artificial knee joint: Secondary | ICD-10-CM | POA: Diagnosis not present

## 2019-07-31 DIAGNOSIS — J45909 Unspecified asthma, uncomplicated: Secondary | ICD-10-CM | POA: Insufficient documentation

## 2019-07-31 DIAGNOSIS — M1990 Unspecified osteoarthritis, unspecified site: Secondary | ICD-10-CM | POA: Insufficient documentation

## 2019-07-31 DIAGNOSIS — Z9981 Dependence on supplemental oxygen: Secondary | ICD-10-CM | POA: Diagnosis not present

## 2019-07-31 DIAGNOSIS — M069 Rheumatoid arthritis, unspecified: Secondary | ICD-10-CM | POA: Diagnosis not present

## 2019-07-31 DIAGNOSIS — K5730 Diverticulosis of large intestine without perforation or abscess without bleeding: Secondary | ICD-10-CM | POA: Diagnosis not present

## 2019-07-31 DIAGNOSIS — I10 Essential (primary) hypertension: Secondary | ICD-10-CM | POA: Diagnosis not present

## 2019-07-31 DIAGNOSIS — Z8711 Personal history of peptic ulcer disease: Secondary | ICD-10-CM | POA: Insufficient documentation

## 2019-07-31 DIAGNOSIS — E785 Hyperlipidemia, unspecified: Secondary | ICD-10-CM | POA: Diagnosis not present

## 2019-07-31 DIAGNOSIS — R933 Abnormal findings on diagnostic imaging of other parts of digestive tract: Secondary | ICD-10-CM | POA: Diagnosis not present

## 2019-07-31 DIAGNOSIS — K219 Gastro-esophageal reflux disease without esophagitis: Secondary | ICD-10-CM | POA: Insufficient documentation

## 2019-07-31 DIAGNOSIS — Z7982 Long term (current) use of aspirin: Secondary | ICD-10-CM | POA: Diagnosis not present

## 2019-07-31 DIAGNOSIS — Z79899 Other long term (current) drug therapy: Secondary | ICD-10-CM | POA: Insufficient documentation

## 2019-07-31 DIAGNOSIS — K635 Polyp of colon: Secondary | ICD-10-CM | POA: Diagnosis not present

## 2019-07-31 DIAGNOSIS — K319 Disease of stomach and duodenum, unspecified: Secondary | ICD-10-CM | POA: Insufficient documentation

## 2019-07-31 DIAGNOSIS — D124 Benign neoplasm of descending colon: Secondary | ICD-10-CM | POA: Insufficient documentation

## 2019-07-31 DIAGNOSIS — K449 Diaphragmatic hernia without obstruction or gangrene: Secondary | ICD-10-CM | POA: Insufficient documentation

## 2019-07-31 DIAGNOSIS — K222 Esophageal obstruction: Secondary | ICD-10-CM | POA: Diagnosis not present

## 2019-07-31 DIAGNOSIS — R1013 Epigastric pain: Secondary | ICD-10-CM

## 2019-07-31 HISTORY — PX: ESOPHAGOGASTRODUODENOSCOPY: SHX5428

## 2019-07-31 HISTORY — PX: BIOPSY: SHX5522

## 2019-07-31 HISTORY — PX: POLYPECTOMY: SHX5525

## 2019-07-31 HISTORY — PX: COLONOSCOPY: SHX5424

## 2019-07-31 SURGERY — COLONOSCOPY
Anesthesia: Moderate Sedation

## 2019-07-31 MED ORDER — LIDOCAINE VISCOUS HCL 2 % MT SOLN
OROMUCOSAL | Status: AC
  Filled 2019-07-31: qty 15

## 2019-07-31 MED ORDER — LIDOCAINE VISCOUS HCL 2 % MT SOLN
OROMUCOSAL | Status: DC | PRN
  Administered 2019-07-31: 1 via OROMUCOSAL

## 2019-07-31 MED ORDER — MEPERIDINE HCL 100 MG/ML IJ SOLN
INTRAMUSCULAR | Status: DC | PRN
  Administered 2019-07-31: 25 mg via INTRAVENOUS

## 2019-07-31 MED ORDER — ONDANSETRON HCL 4 MG/2ML IJ SOLN
INTRAMUSCULAR | Status: AC
  Filled 2019-07-31: qty 2

## 2019-07-31 MED ORDER — ONDANSETRON HCL 4 MG/2ML IJ SOLN
INTRAMUSCULAR | Status: DC | PRN
  Administered 2019-07-31: 4 mg via INTRAVENOUS

## 2019-07-31 MED ORDER — STERILE WATER FOR IRRIGATION IR SOLN
Status: DC | PRN
  Administered 2019-07-31: 12:00:00 2.5 mL

## 2019-07-31 MED ORDER — SODIUM CHLORIDE 0.9 % IV SOLN: INTRAVENOUS | Status: DC

## 2019-07-31 MED ORDER — MEPERIDINE HCL 50 MG/ML IJ SOLN
INTRAMUSCULAR | Status: AC
  Filled 2019-07-31: qty 1

## 2019-07-31 MED ORDER — MIDAZOLAM HCL 5 MG/5ML IJ SOLN
INTRAMUSCULAR | Status: DC | PRN
  Administered 2019-07-31 (×8): 1 mg via INTRAVENOUS

## 2019-07-31 MED ORDER — MIDAZOLAM HCL 5 MG/5ML IJ SOLN
INTRAMUSCULAR | Status: AC
  Filled 2019-07-31: qty 10

## 2019-07-31 NOTE — Op Note (Signed)
[facility] Patient Name: Marie Fuller Procedure Date: 07/31/2019 11:32 AM MRN: [id number] Date of Birth: 02-25-1935 Attending MD: Norvel Richards , MD CSN: [id number] Age: 84 Admit Type: Outpatient Procedure:                Upper GI endoscopy Indications:              Dyspepsia Providers:                Norvel Richards, MD, Lurline Del, RN, Randa Spike, Technician Referring MD:              Medicines:                Midazolam 4 mg IV, Meperidine 25 mg IV, Ondansetron                            4 mg IV Complications:            No immediate complications. Estimated Blood Loss:     Estimated blood loss was minimal. Procedure:                Pre-Anesthesia Assessment:                           - Prior to the procedure, a History and Physical                            was performed, and patient medications and                            allergies were reviewed. The patient's tolerance of                            previous anesthesia was also reviewed. The risks                            and benefits of the procedure and the sedation                            options and risks were discussed with the patient.                            All questions were answered, and informed consent                            was obtained. Prior Anticoagulants: The patient has                            taken no previous anticoagulant or antiplatelet                            agents. ASA Grade Assessment: III - A patient with  severe systemic disease. After reviewing the risks                            and benefits, the patient was deemed in                            satisfactory condition to undergo the procedure.                           After obtaining informed consent, the endoscope was                            passed under direct vision. Throughout the                            procedure, the patient's blood pressure,  pulse, and                            oxygen saturations were monitored continuously. The                            [id number]) scope was introduced through the                            mouth, and advanced to the second part of duodenum.                            The upper GI endoscopy was accomplished without                            difficulty. The patient tolerated the procedure                            well. Scope In: 11:59:24 AM Scope Out: 12:07:16 PM Total Procedure Duration: 0 hours 7 minutes 52 seconds  Findings:      A non-obstructing Schatzki ring was found at the gastroesophageal       junction.      A large hiatal hernia was present. Numerous 2 to 3 mm erosions seen       throughout the stomach. Erythematous mucosa with areas of pale mucosa       with "mottled/"geographic" distribution; no obvious infiltrating process       or ulcer seen. Pylorus patent. Examination of the first and second       portion of the duodenum appeared normal. Biopsies of the abnormal       gastric mucosa taken. Impression:               - Non-obstructing Schatzki ring not manipulated (no                            dysphagia) abnormal gastric mucosa.                           - Large hiatal hernia.                           -  Abnormal gastric mucosa -S/P post biopsy Moderate Sedation:      Moderate (conscious) sedation was administered by the endoscopy nurse       and supervised by the endoscopist. The following parameters were       monitored: oxygen saturation, heart rate, blood pressure, respiratory       rate, EKG, adequacy of pulmonary ventilation, and response to care.       Total physician intraservice time was 17 minutes. Recommendation:           - Patient has a contact number available for                            emergencies. The signs and symptoms of potential                            delayed complications were discussed with the                            patient.  Return to normal activities tomorrow.                            Written discharge instructions were provided to the                            patient.                           - Resume previous diet.                           - Continue present medications. Continue                            pantoprazole 40 mg daily. Follow-up on pathology.-                            Return to my office (date not yet determined). See                            colonoscopy report. Procedure Code(s):        --- Professional ---                           [phone], Esophagogastroduodenoscopy, flexible,                            transoral; diagnostic, including collection of                            specimen(s) by brushing or washing, when performed                            (separate procedure)                           G0500, Moderate sedation services provided by the  same physician or other qualified health care                            professional performing a gastrointestinal                            endoscopic service that sedation supports,                            requiring the presence of an independent trained                            observer to assist in the monitoring of the                            patient's level of consciousness and physiological                            status; initial 15 minutes of intra-service time;                            patient age 77 years or older (additional time may                            be reported with [phone], as appropriate) Diagnosis Code(s):        --- Professional ---                           K22.2, Esophageal obstruction                           K44.9, Diaphragmatic hernia without obstruction or                            gangrene                           R10.13, Epigastric pain CPT copyright 2019 [employer]. All rights reserved. The codes documented in this report are preliminary and  upon coder review may  be revised to meet current compliance requirements. Cristopher Estimable. Herberta Pickron, MD Norvel Richards, MD 07/31/2019 1:42:36 PM This report has been signed electronically. Number of Addenda: 0

## 2019-07-31 NOTE — Op Note (Signed)
[facility] Patient Name: Marie Fuller Procedure Date: 07/31/2019 12:09 PM MRN: [id number] Date of Birth: March 11, 1935 Attending MD: Norvel Richards , MD CSN: [id number] Age: 84 Admit Type: Outpatient Procedure:                Colonoscopy Indications:              Abnormal CT of the GI tract Providers:                Norvel Richards, MD, Lurline Del, RN, Randa Spike, Technician Referring MD:              Medicines:                Midazolam 8 mg IV, Meperidine 40 mg IV, Ondansetron                            4 mg IV Complications:            No immediate complications. Estimated Blood Loss:     Estimated blood loss was minimal. Procedure:                Pre-Anesthesia Assessment:                           - Prior to the procedure, a History and Physical                            was performed, and patient medications and                            allergies were reviewed. The patient's tolerance of                            previous anesthesia was also reviewed. The risks                            and benefits of the procedure and the sedation                            options and risks were discussed with the patient.                            All questions were answered, and informed consent                            was obtained. Prior Anticoagulants: The patient has                            taken no previous anticoagulant or antiplatelet                            agents. ASA Grade Assessment: III - A patient with  severe systemic disease. After reviewing the risks                            and benefits, the patient was deemed in                            satisfactory condition to undergo the procedure.                           After obtaining informed consent, the colonoscope                            was passed under direct vision. Throughout the                            procedure, the patient's  blood pressure, pulse, and                            oxygen saturations were monitored continuously. The                            [id number] scope was introduced through                            the anus and advanced to the the cecum, identified                            by appendiceal orifice and ileocecal valve. The                            patient tolerated the procedure well. The ileocecal                            valve, appendiceal orifice, and rectum were                            photographed. Scope In: 12:13:33 PM Scope Out: 12:47:20 PM Scope Withdrawal Time: 0 hours 12 minutes 24 seconds  Total Procedure Duration: 0 hours 33 minutes 47 seconds  Findings:      The perianal and digital rectal examinations were normal. Acute flexure       in the proximal sigmoid. Noncompliant sigmoid segment. Could not       negotiate adult colonoscope upstream at this level. Withdrew and       obtained the pediatric colonoscope. Moving the patient were left lateral       cubitus to supine I was able to negotiate through this segment on into       the proximal colon to the cecum.      The patient had numerous diverticula throughout the sigmoid and distal       descending segment. Appeared to have a fixed segment of proximal       sigmoid. However, aside from diverticula, the mucosa appeared entirely       normal through this segment. It is notable there were a couple of pieces       of formed  stool upstream of the noncompliant segment, but, the colon was       otherwise empty. Otherwise, the entire colon appeared normal aside from       the 2 polyps described below.      Two sessile polyps were found in the descending colon and ascending       colon. The polyps were 4 to 5 mm in size. These polyps were removed with       a cold snare. Resection and retrieval were complete. Estimated blood       loss: minimal. Impression:               - Two 4 to 5 mm polyps in the descending  colon and                            in the ascending colon, removed with a cold snare.                            Resected and retrieved. Dense left-sided                            diverticula with with a fixed noncompliant acute                            flexure in the proximal sigmoid. Finding likely                            representing what was seen on CT; Likely producing                            a mild "bottleneck" at this level. She does note                            bowel function has improved with Linzess. Moderate Sedation:      Moderate (conscious) sedation was administered by the endoscopy nurse       and supervised by the endoscopist. The following parameters were       monitored: oxygen saturation, heart rate, blood pressure, respiratory       rate, EKG, adequacy of pulmonary ventilation, and response to care.       Total physician intraservice time was 57 minutes. Recommendation:           - Patient has a contact number available for                            emergencies. The signs and symptoms of potential                            delayed complications were discussed with the                            patient. Return to normal activities tomorrow.                            Written discharge instructions were provided to the  patient.                           - Advance diet as tolerated. Continue Linzess                            daily. Follow-up on pathology.                           Office visit with [place] in 3 months. If persisting                            symptoms, she may need an a barium enema to assess                            diameter of colonic lumen. Procedure Code(s):        --- Professional ---                           [phone], Colonoscopy, flexible; with removal of                            tumor(s), polyp(s), or other lesion(s) by snare                            technique                           99153, Moderate  sedation; each additional 15                            minutes intraservice time                           99153, Moderate sedation; each additional 15                            minutes intraservice time                           99153, Moderate sedation; each additional 15                            minutes intraservice time                           G0500, Moderate sedation services provided by the                            same physician or other qualified health care                            professional performing a gastrointestinal                            endoscopic service that sedation supports,  requiring the presence of an independent trained                            observer to assist in the monitoring of the                            patient's level of consciousness and physiological                            status; initial 15 minutes of intra-service time;                            patient age 31 years or older (additional time may                            be reported with [phone], as appropriate) Diagnosis Code(s):        --- Professional ---                           K63.5, Polyp of colon                           R93.3, Abnormal findings on diagnostic imaging of                            other parts of digestive tract CPT copyright 2019 [employer]. All rights reserved. The codes documented in this report are preliminary and upon coder review may  be revised to meet current compliance requirements. Cristopher Estimable. Ranbir Chew, MD Norvel Richards, MD 07/31/2019 12:59:31 PM This report has been signed electronically. Number of Addenda: 0

## 2019-07-31 NOTE — Discharge Instructions (Signed)
Colonoscopy Discharge Instructions  Read the instructions outlined below and refer to this sheet in the next few weeks. These discharge instructions provide you with general information on caring for yourself after you leave the hospital. Your doctor may also give you specific instructions. While your treatment has been planned according to the most current medical practices available, unavoidable complications occasionally occur. If you have any problems or questions after discharge, call Dr. Gala Romney at [phone]. ACTIVITY  You may resume your regular activity, but move at a slower pace for the next 24 hours.   Take frequent rest periods for the next 24 hours.   Walking will help get rid of the air and reduce the bloated feeling in your belly (abdomen).   No driving for 24 hours (because of the medicine (anesthesia) used during the test).    Do not sign any important legal documents or operate any machinery for 24 hours (because of the anesthesia used during the test).  NUTRITION  Drink plenty of fluids.   You may resume your normal diet as instructed by your doctor.   Begin with a light meal and progress to your normal diet. Heavy or fried foods are harder to digest and may make you feel sick to your stomach (nauseated).   Avoid alcoholic beverages for 24 hours or as instructed.  MEDICATIONS  You may resume your normal medications unless your doctor tells you otherwise.  WHAT YOU CAN EXPECT TODAY  Some feelings of bloating in the abdomen.   Passage of more gas than usual.   Spotting of blood in your stool or on the toilet paper.  IF YOU HAD POLYPS REMOVED DURING THE COLONOSCOPY:  No aspirin products for 7 days or as instructed.   No alcohol for 7 days or as instructed.   Eat a soft diet for the next 24 hours.  FINDING OUT THE RESULTS OF YOUR TEST Not all test results are available during your visit. If your test results are not back during the visit, make an appointment  with your caregiver to find out the results. Do not assume everything is normal if you have not heard from your caregiver or the medical facility. It is important for you to follow up on all of your test results.  SEEK IMMEDIATE MEDICAL ATTENTION IF:  You have more than a spotting of blood in your stool.   Your belly is swollen (abdominal distention).   You are nauseated or vomiting.   You have a temperature over 101.   You have abdominal pain or discomfort that is severe or gets worse throughout the day.    EGD Discharge instructions Please read the instructions outlined below and refer to this sheet in the next few weeks. These discharge instructions provide you with general information on caring for yourself after you leave the hospital. Your doctor may also give you specific instructions. While your treatment has been planned according to the most current medical practices available, unavoidable complications occasionally occur. If you have any problems or questions after discharge, please call your doctor. ACTIVITY  You may resume your regular activity but move at a slower pace for the next 24 hours.   Take frequent rest periods for the next 24 hours.   Walking will help expel (get rid of) the air and reduce the bloated feeling in your abdomen.   No driving for 24 hours (because of the anesthesia (medicine) used during the test).   You may shower.   Do not sign  any important legal documents or operate any machinery for 24 hours (because of the anesthesia used during the test).  NUTRITION  Drink plenty of fluids.   You may resume your normal diet.   Begin with a light meal and progress to your normal diet.   Avoid alcoholic beverages for 24 hours or as instructed by your caregiver.  MEDICATIONS  You may resume your normal medications unless your caregiver tells you otherwise.  WHAT YOU CAN EXPECT TODAY  You may experience abdominal discomfort such as a feeling of  fullness or "gas" pains.  FOLLOW-UP  Your doctor will discuss the results of your test with you.  SEEK IMMEDIATE MEDICAL ATTENTION IF ANY OF THE FOLLOWING OCCUR:  Excessive nausea (feeling sick to your stomach) and/or vomiting.   Severe abdominal pain and distention (swelling).   Trouble swallowing.   Temperature over 101 F (37.8 C).   Rectal bleeding or vomiting of blood.   You have a large hiatal hernia.  Your stomach was inflamed.  I did not see an ulcer or tumor.  I biopsied your stomach  Continue taking pantoprazole 40 mg daily  You had 2 polyps in your colon I removed.  He had lots of diverticulosis.  Mild kink in your colon.  May be contributing to constipation.  Good news is I did not find a tumor in your colon  Continue taking Linzess daily to help your bowels move  Office visit with [place] in 3 months  I will be in touch with you via letter when I get the biopsies back in about a week  At patient's request, I called Francoise Ceo at [phone].  No answer.

## 2019-07-31 NOTE — Interval H&P Note (Signed)
History and Physical Interval Note:  07/31/2019 11:33 AM  Tedra Coupe  has presented today for surgery, with the diagnosis of abnormal sigmoid colon on CT, cecal distention, large hiatal hernia, epigastric pain.  The various methods of treatment have been discussed with the patient and family. After consideration of risks, benefits and other options for treatment, the patient has consented to  Procedure(s) with comments: COLONOSCOPY (N/A) - 2:00pm ESOPHAGOGASTRODUODENOSCOPY (EGD) (N/A) as a surgical intervention.  The patient's history has been reviewed, patient examined, no change in status, stable for surgery.  I have reviewed the patient's chart and labs.  Questions were answered to the patient's satisfaction.     Harrell Niehoff   Abdominal pain has improved.  No dysphagia.  Abnormal sigmoid colon on CT.  Large hiatal hernia.  EGD and colonoscopy today per plan.  The risks, benefits, limitations, imponderables and alternatives regarding both EGD and colonoscopy have been reviewed with the patient. Questions have been answered. All parties agreeable.

## 2019-08-01 ENCOUNTER — Other Ambulatory Visit: Payer: Self-pay

## 2019-08-01 LAB — SURGICAL PATHOLOGY

## 2019-08-04 ENCOUNTER — Encounter: Payer: Self-pay | Admitting: Internal Medicine

## 2019-08-05 ENCOUNTER — Telehealth: Payer: Self-pay | Admitting: Internal Medicine

## 2019-08-05 NOTE — Telephone Encounter (Signed)
Called patient x 3 and line rings busy

## 2019-08-05 NOTE — Telephone Encounter (Signed)
Routing to refill in-basket

## 2019-08-05 NOTE — Telephone Encounter (Signed)
Pt said she used her linzess samples and needs a prescription called into her pharmacy. [facility] [place], [place].

## 2019-08-06 MED ORDER — LINACLOTIDE 145 MCG PO CAPS: 145.0000 ug | ORAL_CAPSULE | Freq: Every day | ORAL | 3 refills | Status: DC

## 2019-08-06 NOTE — Addendum Note (Signed)
Addended by: Mahala Menghini on: 08/06/2019 05:28 PM   Modules accepted: Orders

## 2019-09-19 ENCOUNTER — Encounter: Payer: Self-pay | Admitting: Gastroenterology

## 2019-09-30 ENCOUNTER — Other Ambulatory Visit: Payer: Self-pay | Admitting: Gastroenterology

## 2019-10-01 ENCOUNTER — Telehealth: Payer: Self-pay

## 2019-10-01 NOTE — Telephone Encounter (Signed)
duplicate

## 2019-10-01 NOTE — Telephone Encounter (Signed)
Pt is requesting a 90 day supply of Pantoprazole 40 mg be sent to [facility], [place], [place].

## 2019-10-29 ENCOUNTER — Ambulatory Visit (INDEPENDENT_AMBULATORY_CARE_PROVIDER_SITE_OTHER): Payer: Medicare Other | Admitting: Gastroenterology

## 2019-10-29 ENCOUNTER — Encounter: Payer: Self-pay | Admitting: Gastroenterology

## 2019-10-29 ENCOUNTER — Other Ambulatory Visit: Payer: Self-pay

## 2019-10-29 VITALS — BP 135/72 | HR 73 | Temp 97.0°F | Ht 60.0 in | Wt 145.0 lb

## 2019-10-29 DIAGNOSIS — K219 Gastro-esophageal reflux disease without esophagitis: Secondary | ICD-10-CM | POA: Diagnosis not present

## 2019-10-29 DIAGNOSIS — R1010 Upper abdominal pain, unspecified: Secondary | ICD-10-CM

## 2019-10-29 DIAGNOSIS — K449 Diaphragmatic hernia without obstruction or gangrene: Secondary | ICD-10-CM | POA: Diagnosis not present

## 2019-10-29 DIAGNOSIS — K5900 Constipation, unspecified: Secondary | ICD-10-CM | POA: Diagnosis not present

## 2019-10-29 MED ORDER — LUBIPROSTONE 24 MCG PO CAPS: 24.0000 ug | ORAL_CAPSULE | Freq: Two times a day (BID) | ORAL | 5 refills | Status: AC

## 2019-10-29 NOTE — Patient Instructions (Signed)
1. Take pantoprazole 30 minutes before your lunch. 2. Start Amitiza 24 mcg twice daily with a meal for constipation. If too strong, back down to once daily. Call if you are unable to afford medication. 3. Call in two weeks and let me know if you are any better.

## 2019-10-29 NOTE — Progress Notes (Signed)
Primary Care Physician: Normand Sloop, MD  Primary Gastroenterologist:  Garfield Cornea, MD   Chief Complaint  Patient presents with  . Abdominal Pain    pain 4-5 hours every evening.   . change in bowels    not moving as great as before. Was not able to afford linzess    HPI: Marie Fuller is a 85 y.o. female here for follow-up.  Last seen in November 2020.  Seen at that time with epigastric pain that started after taking Bactrim.  Also with issues with constipation.  Previously had been on omeprazole for years.  Recent EGD and colonoscopy.   EGD 07/2019: non-obstructing Schatzki ring, large hiatal hernia, numerous 2 to 3 mm erosions throughout the stomach, erythematous mucosa with moderate/geographic distribution, no obvious infiltrating process or ulcer.  Biopsies with reactive gastropathy with focal intestinal metaplasia, negative for H. pylori.  Colonoscopy December 2020: She had two 4 to 5 mm polyps removed, dense left-sided diverticula with a fixed noncompliant acute flexure in the proximal sigmoid.  Likely producing a mild "bottleneck" at this level.  Ascending colon polyp benign, descending colon polyp tubular adenoma.  CT abdomen pelvis with contrast November 2020 to further evaluate her abdominal pain.  She has a large hiatal hernia with intrathoracic position of the gastric body and fundus, unchanged from prior exam in 2016.  She has a large pelvic floor cystocele.  Cecum distended 8 cm, patulous colon.  Sigmoid colon suspicious for subtle circumferential lesion.  Started on pantoprazole 40 mg daily back in November.  Previously had been on omeprazole without significant improvement.  In fact stopping omeprazole did not seem to make a difference.  Continues to have problems most evenings.  Symptoms usually start after a meal and last for 4 to 5 hours.  Usually after her afternoon meal.  She has been trying to consume soft foods only to see if this will help.  Denies any  weight loss.  She is quite frustrated with persisting pain, states it can be unbearable at times and the only thing she can do was lay down to alleviate some of her other pain i.e. from arthritis.  States she cannot take her abdominal pain and arthritis pain at the same time.  States she has been more agitated due to the pain, loses her patience with her's husband who has Parkinson's.  Pain located in the epigastrium, radiates downward to the center of her lower abdomen.  Bowel movements remain difficult.  She feels like her stools collected in the rectal area and then she requires stool or suppository to help facilitate bowel movement.  Previously had reported that Linzess was helping but she was unable to afford the medication, $400 per prescription.  She was provided samples prior to her colonoscopy.  Denies melena or rectal bleeding.  States that all the symptoms started after taking Bactrim last year.   Current Outpatient Medications  Medication Sig Dispense Refill  . Alum & Mag Hydroxide-Simeth (MYLANTA PO) Take by mouth as needed.    Marland Kitchen aspirin EC 81 MG tablet Take 81 mg by mouth daily.    . Calcium Carbonate-Vitamin D (CALTRATE 600+D PO) Take 1 tablet by mouth daily.     . cholecalciferol (VITAMIN D) 1000 UNITS tablet Take 1,000 Units by mouth daily.    . Glycerin, Laxative, ([facility] RE) Place rectally as needed.    . loratadine (CLARITIN) 10 MG tablet Take 10 mg by mouth daily.   6  .  losartan-hydrochlorothiazide (HYZAAR) 50-12.5 MG tablet Take 1 tablet by mouth daily.    . metoprolol tartrate (LOPRESSOR) 25 MG tablet Take 12.5 mg by mouth daily.    . Multiple Vitamin (MULTIVITAMIN) tablet Take 1 tablet by mouth daily.    . Omega-3 Fatty Acids (FISH OIL) 1000 MG CAPS Take by mouth daily.    . OXYGEN Inhale 2 L into the lungs at bedtime.    . pantoprazole (PROTONIX) 40 MG tablet TAKE 1 TABLET BY MOUTH EVERY DAY 90 tablet 3  . simvastatin (ZOCOR) 40 MG tablet Take 40 mg by  mouth daily.     No current facility-administered medications for this visit.    Allergies as of 10/29/2019 - Review Complete 10/29/2019  Allergen Reaction Noted  . Bactrim [sulfamethoxazole-trimethoprim]  07/01/2019  . Sulfa antibiotics Other (See Comments) 07/24/2019    ROS:  General: Negative for anorexia, weight loss, fever, chills, fatigue, weakness. ENT: Negative for hoarseness, difficulty swallowing , nasal congestion. CV: Negative for chest pain, angina, palpitations, dyspnea on exertion, peripheral edema.  Respiratory: Negative for dyspnea at rest, dyspnea on exertion, cough, sputum, wheezing.  GI: See history of present illness. GU:  Negative for dysuria, hematuria, urinary incontinence, urinary frequency, nocturnal urination.  Endo: Negative for unusual weight change.    Physical Examination:   BP 135/72   Pulse 73   Temp (!) 97 F (36.1 C) (Oral)   Ht 5' (1.524 m)   Wt 145 lb (65.8 kg)   BMI 28.32 kg/m   General: Well-nourished, well-developed in no acute distress.  Eyes: No icterus. Mouth: masked Lungs: Clear to auscultation bilaterally.  Heart: Regular rate and rhythm, no murmurs rubs or gallops.  Abdomen: Bowel sounds are normal, mild epig tenderness, nondistended, no hepatosplenomegaly or masses, no abdominal bruits or hernia , no rebound or guarding.   Extremities: No lower extremity edema. No clubbing or deformities. Neuro: Alert and oriented x 4   Skin: Warm and dry, no jaundice.   Psych: Alert and cooperative, normal mood and affect.  Labs:  Lab Results  Component Value Date   CREATININE 0.72 07/01/2019   BUN 12 07/01/2019   NA 139 07/01/2019   K 4.6 07/01/2019   CL 100 07/01/2019   CO2 28 07/01/2019   Lab Results  Component Value Date   ALT 13 07/01/2019   AST 19 07/01/2019   ALKPHOS 70 07/01/2019   BILITOT 0.4 07/01/2019   Lab Results  Component Value Date   WBC 6.5 07/01/2019   HGB 11.4 (L) 07/01/2019   HCT 37.9 07/01/2019   MCV  88.1 07/01/2019   PLT 254 07/01/2019     Imaging Studies: No results found.

## 2019-10-30 NOTE — Assessment & Plan Note (Addendum)
85 year old female with persisting postprandial abdominal pain starting the epigastrium and radiates downward into the lower abdomen.  She states symptoms began after taking Bactrim in July 2020.  Pain is persisting but not quite as severe as it was back then.  She does pretty well throughout the day but after she eats around 3 to 4 PM she develops abdominal pain lasting for 4 to 5 hours.  Initially on omeprazole to switch to pantoprazole taken to fall.  Really has noted no improvement in her symptoms.  EGD demonstrated gastritis as well as large hiatal hernia as outlined.  CT imaging showed large hiatal hernia with intrathoracic position of the gastric body and fundus stable from 2016, large pelvic floor cystocele, cecum distended 8 cm, abnormality the sigmoid colon.  Subsequent colonoscopy showed dense left-sided diverticula with fixed noncompliant acute flexure in the proximal sigmoid likely producing mild bottleneck at this area.  Also had tubular adenoma removed.  Patient has a longstanding history of large hiatal hernia, unclear if this is contributing to her more recent symptoms which she feels began after taking Bactrim.  Ongoing gastritis on EGD.  No significant improvement with PPI therapy.  Interestingly however she does well with breakfast and in the beginning of the day but has more issues after eating in the afternoon.  We will have her adjust her PPI therapy, taking 30 minutes before lunch.  If significant improvement in her symptoms will continue once daily dosing, otherwise she will let me know we will increase to twice daily.  Ultimately we may need to get surgical consultation for opinion regarding hiatal hernia repair.  Ongoing constipation in the setting of fixed sigmoid colon, large pelvic floor cystocele.  Unable to afford Linzess.  We will see if Amitiza is more affordable.  Samples for 24 mcg to take twice daily with food as well as prescription provided.  She will let me know if she  cannot afford the medication.  She will call with a progress report in couple weeks.

## 2019-11-01 ENCOUNTER — Telehealth: Payer: Self-pay | Admitting: Internal Medicine

## 2019-11-01 NOTE — Telephone Encounter (Signed)
Pt saw LSL on 10/29/2019 and called today to let us know that the prescription was over $300 and she can only afford something 20-30 dollars a month. [facility] [address] [facility] [place]. Please advise [phone]

## 2019-11-01 NOTE — Telephone Encounter (Signed)
Spoke with Marie Fuller. Marie Fuller's Marie Fuller 24 mcg RX will cost over $400.00 and is covered under pts insurance. Pts insurance plan makes her pay 40% of the medications. Linzess was over $400.00 for Marie Fuller and was covered under pts plan.  Marie Fuller would like to know if she should try otc Miralax, please advise.

## 2019-11-11 MED ORDER — POLYETHYLENE GLYCOL 3350 17 GM/SCOOP PO POWD: ORAL | 3 refills | Status: DC

## 2019-11-11 NOTE — Telephone Encounter (Signed)
Noted. Spoke with pt. Pt is aware that RX was sent in but if not covered under insurance, she can purchase it otc.

## 2019-11-11 NOTE — Telephone Encounter (Signed)
Yes she can try miralax 17 grams up to twice daily to have adequate bowel movement. Have her call with progress report in couple of weeks.   I sent in RX for miralax however she may have to buy over the counter if not covered by insurance.

## 2019-11-11 NOTE — Addendum Note (Signed)
Addended by: Mahala Menghini on: 11/11/2019 08:56 AM   Modules accepted: Orders

## 2020-10-11 ENCOUNTER — Other Ambulatory Visit: Payer: Self-pay | Admitting: Gastroenterology

## 2021-09-03 IMAGING — CT CT ABD-PELV W/ CM
3 of 4 series · 12 of 46 positions shown, 17 images · IV contrast (Omnipaque or Isovue)
Comparison: 12/30/2014

CLINICAL DATA: Upper abdominal pain, right lower quadrant pain,
history of cholecystectomy, appendectomy, hysterectomy

EXAM:
CT ABDOMEN AND PELVIS WITH CONTRAST
TECHNIQUE: Multidetector CT imaging of the abdomen and pelvis was performed
using the standard protocol following bolus administration of
intravenous contrast.
CONTRAST:  100mL OMNIPAQUE IOHEXOL 300 MG/ML SOLN, additional oral
enteric contrast

[Series 5: coronal st · coronal · 0.80mm/px · 3 of 89 slices shown]
[im 30/89  soft-tissue]
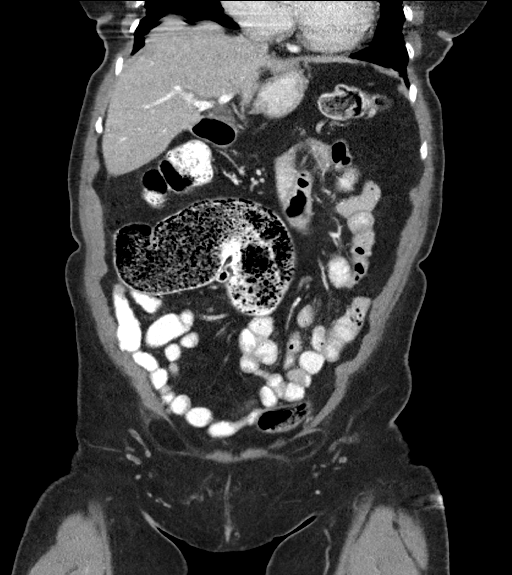
[im 40/89  soft-tissue]
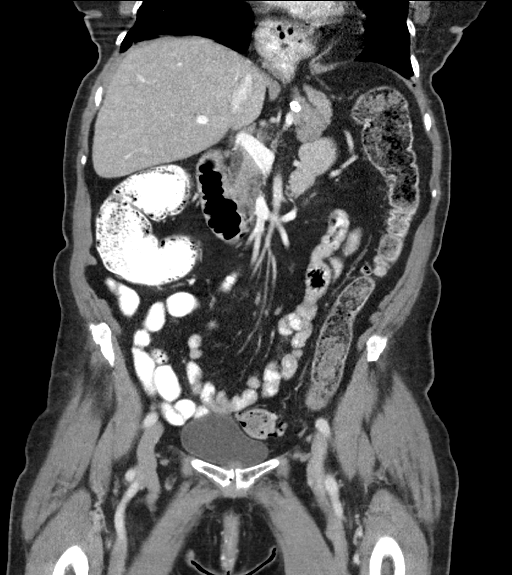
[im 49/89  soft-tissue]
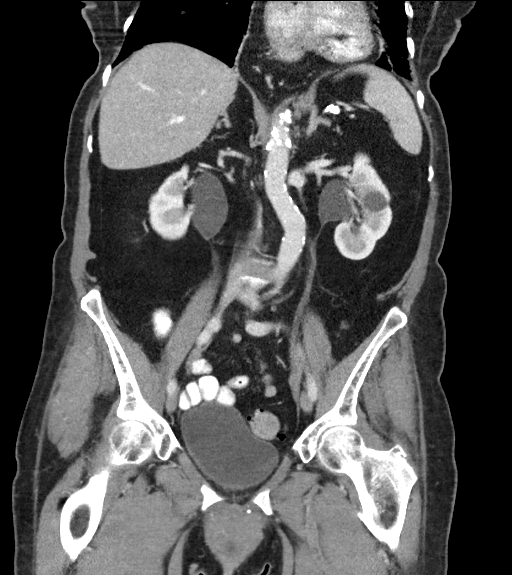

[Series 6: sagittal st · sagittal · 0.67mm/px · 1 of 108 slices shown]
[im 36/108  soft-tissue]
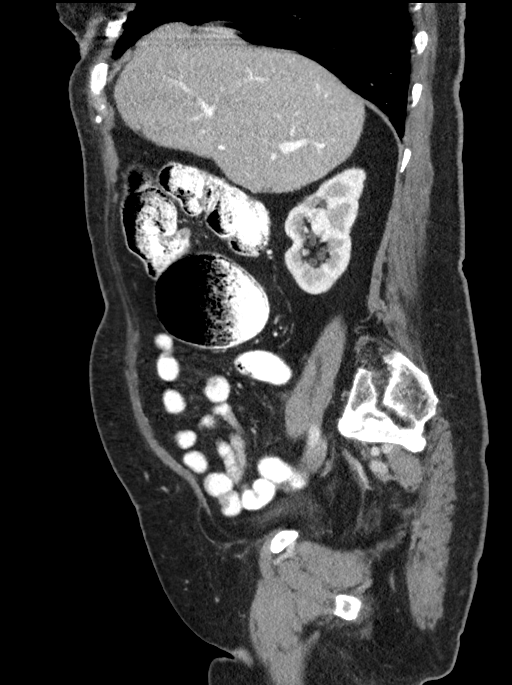

[Series 7: delay · axial · delayed · 0.66mm/px · z∈[+887,+1017]mm · 8 of 34 slices shown, 13 images]
[im 4/34  soft-tissue]
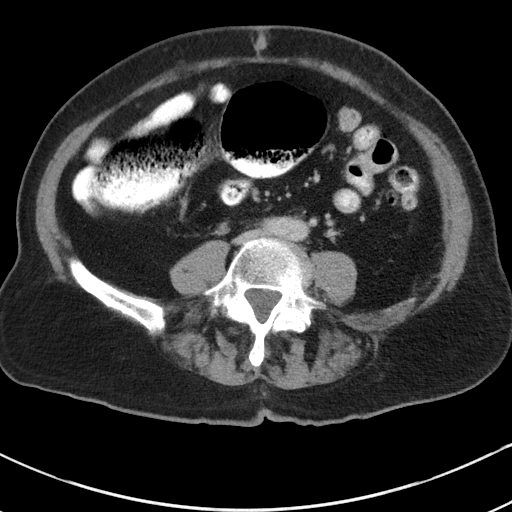
[im 4/34  bone]
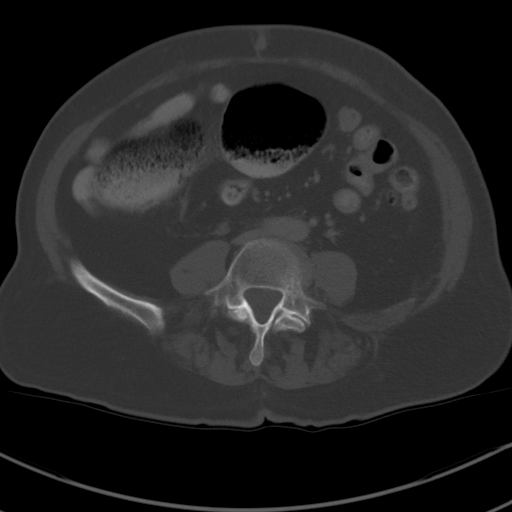
[im 8/34  soft-tissue]
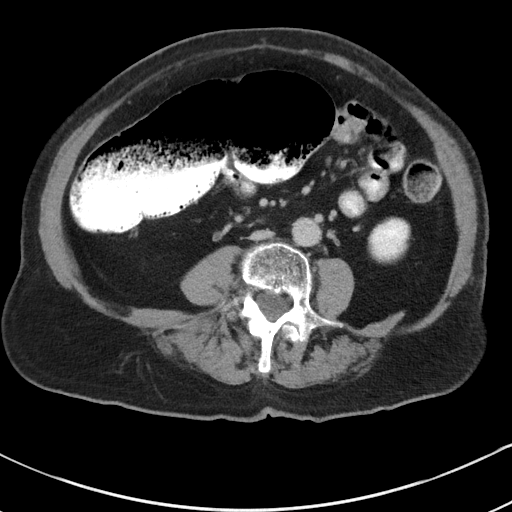
[im 12/34  soft-tissue]
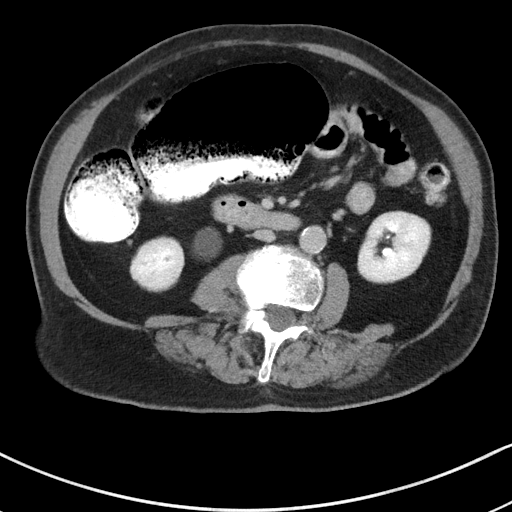
[im 15/34  soft-tissue]
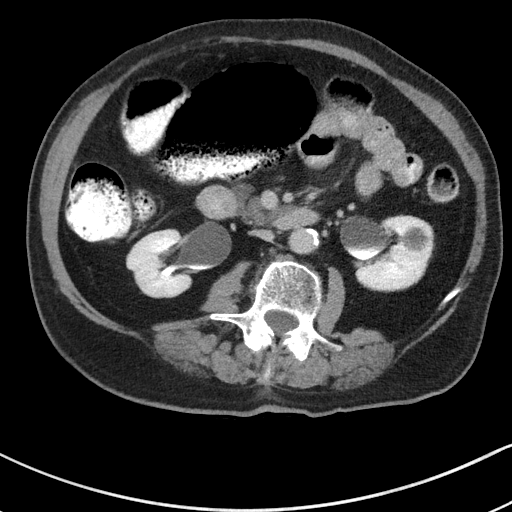
[im 19/34  soft-tissue]
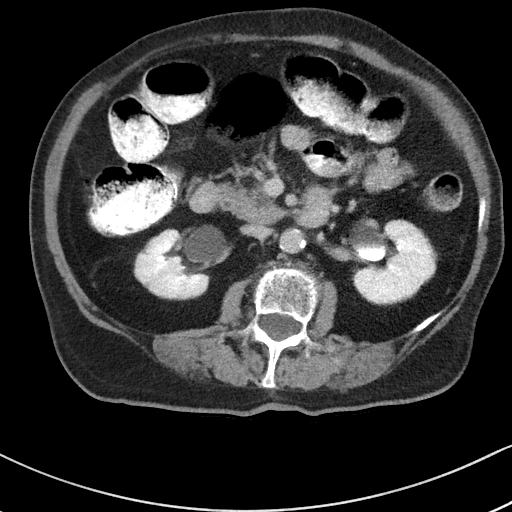
[im 19/34  lung]
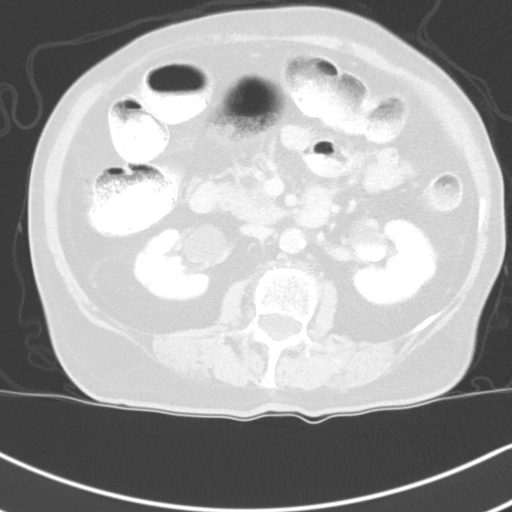
[im 23/34  soft-tissue]
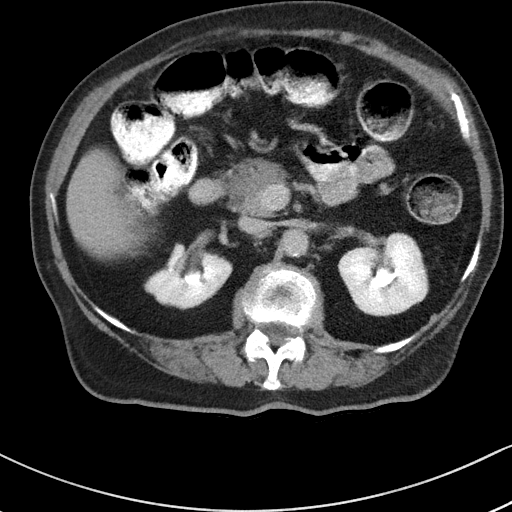
[im 23/34  lung]
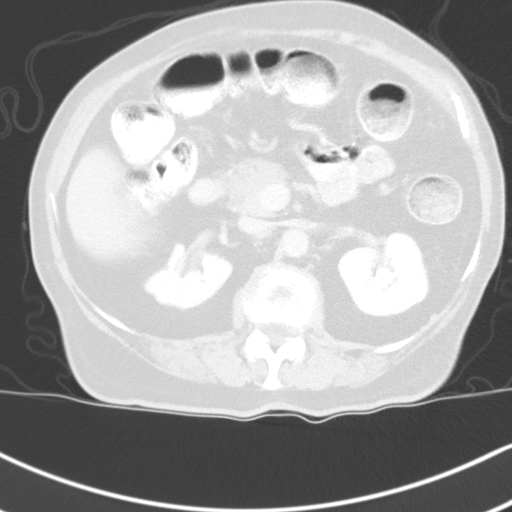
[im 26/34  soft-tissue]
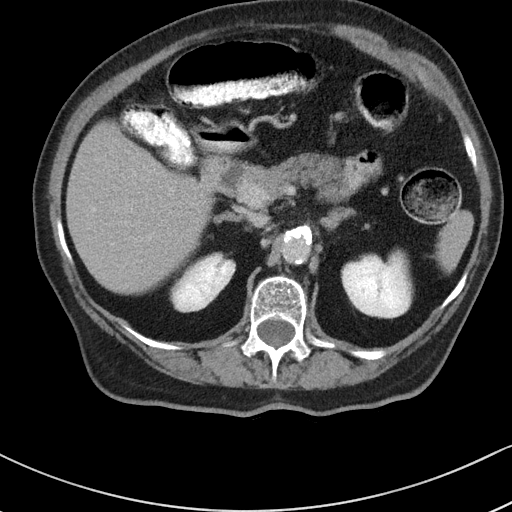
[im 26/34  lung]
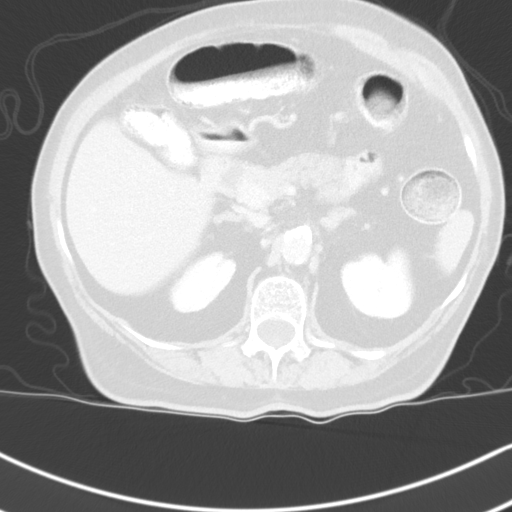
[im 30/34  soft-tissue]
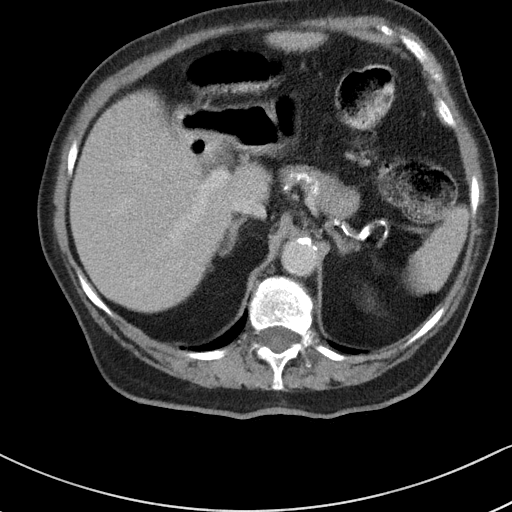
[im 30/34  lung]
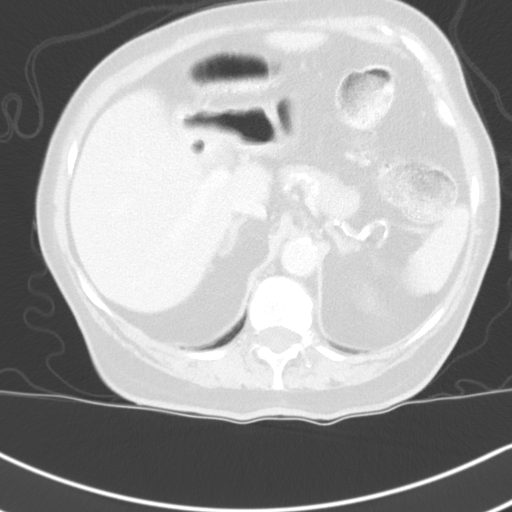

[12 of 46 positions shown; findings below may reference images not displayed]

FINDINGS: Lower chest: No acute abnormality. Large hiatal hernia with
intrathoracic position of the gastric body and fundus, unchanged
compared to prior examination.

Hepatobiliary: No focal liver abnormality is seen. Hepatic
steatosis. Status post cholecystectomy. No biliary dilatation.

Pancreas: Unremarkable. No pancreatic ductal dilatation or
surrounding inflammatory changes.

Spleen: Normal in size without significant abnormality.

Adrenals/Urinary Tract: Adrenal glands are unremarkable. Prominent
bilateral extrarenal pelves. Kidneys are otherwise normal, without
renal calculi, solid lesion, or hydronephrosis. Large pelvic floor
cystocele (series 6, image 49).

Stomach/Bowel: Stomach is within normal limits. Appendix is
surgically absent. Descending and sigmoid diverticulosis. The colon
is diffusely patulous, stool and fluid-filled, the cecum
particularly distended, measuring up to 8 cm in caliber (series 2,
image 39). Appearance of the sigmoid colon on coronal images is
suspicious for a subtle circumferential lesion of the distal sigmoid
(series 5, image 60), with a relatively decompressed rectum distal
to this point.

Vascular/Lymphatic: Aortic atherosclerosis. No enlarged abdominal or
pelvic lymph nodes.

Reproductive: Status post hysterectomy.

Other: No abdominal wall hernia or abnormality. No abdominopelvic
ascites.

Musculoskeletal: No acute or significant osseous findings.
IMPRESSION: 1. Descending and sigmoid diverticulosis. The colon is diffusely
patulous, stool and fluid-filled, the cecum particularly distended,
measuring up to 8 cm in caliber (series 2, image 39). Appearance of
the sigmoid colon on coronal images is suspicious for a subtle
circumferential lesion of the distal sigmoid (series 5, image 60),
with a relatively decompressed rectum distal to this point. Consider
colonoscopy to exclude obstipating mass. The appearance of the colon
may also be due to pseudo-obstruction.

2. Large hiatal hernia with intrathoracic position of the gastric
body and fundus, unchanged compared to prior examination.

3. Large pelvic floor cystocele (series 6, image 49).

4.  Status post hysterectomy, appendectomy, and cholecystectomy.

5.  Hepatic steatosis.

6.  Aortic atherosclerosis.

## 2021-11-15 ENCOUNTER — Other Ambulatory Visit: Payer: Self-pay | Admitting: Gastroenterology

## 2021-11-22 ENCOUNTER — Other Ambulatory Visit: Payer: Self-pay | Admitting: Gastroenterology

## 2021-11-29 ENCOUNTER — Other Ambulatory Visit: Payer: Self-pay | Admitting: Gastroenterology

## 2021-11-29 NOTE — Telephone Encounter (Signed)
Last ov 10/29/19 ?
